# Patient Record
Sex: Male | Born: 1985
Health system: Southern US, Community
[De-identification: ages and names within clinical notes are randomized; demographics above are authoritative.]

## PROBLEM LIST (undated history)

## (undated) DIAGNOSIS — T7840XA Allergy, unspecified, initial encounter: Secondary | ICD-10-CM

## (undated) DIAGNOSIS — Z9109 Other allergy status, other than to drugs and biological substances: Secondary | ICD-10-CM

## (undated) HISTORY — DX: Allergy, unspecified, initial encounter: T78.40XA

## (undated) HISTORY — PX: NO PAST SURGERIES: SHX2092

---

## 2012-11-09 ENCOUNTER — Emergency Department (HOSPITAL_BASED_OUTPATIENT_CLINIC_OR_DEPARTMENT_OTHER): Payer: BC Managed Care – PPO

## 2012-11-09 ENCOUNTER — Encounter (HOSPITAL_BASED_OUTPATIENT_CLINIC_OR_DEPARTMENT_OTHER): Payer: Self-pay

## 2012-11-09 ENCOUNTER — Emergency Department (HOSPITAL_BASED_OUTPATIENT_CLINIC_OR_DEPARTMENT_OTHER)
Admission: EM | Admit: 2012-11-09 | Discharge: 2012-11-09 | Disposition: A | Discharge status: Home / Self Care | Payer: BC Managed Care – PPO | Attending: Emergency Medicine | Admitting: Emergency Medicine

## 2012-11-09 DIAGNOSIS — J309 Allergic rhinitis, unspecified: Secondary | ICD-10-CM | POA: Insufficient documentation

## 2012-11-09 DIAGNOSIS — M542 Cervicalgia: Secondary | ICD-10-CM

## 2012-11-09 DIAGNOSIS — R109 Unspecified abdominal pain: Secondary | ICD-10-CM | POA: Insufficient documentation

## 2012-11-09 DIAGNOSIS — J029 Acute pharyngitis, unspecified: Secondary | ICD-10-CM | POA: Insufficient documentation

## 2012-11-09 HISTORY — DX: Other allergy status, other than to drugs and biological substances: Z91.09

## 2012-11-09 LAB — CBC WITH DIFFERENTIAL/PLATELET
Eosinophils Relative: 3 % (ref 0–5)
HCT: 40.2 % (ref 39.0–52.0)
Lymphocytes Relative: 35 % (ref 12–46)
Lymphs Abs: 3.4 10*3/uL (ref 0.7–4.0)
MCV: 89.7 fL (ref 78.0–100.0)
Monocytes Absolute: 0.9 10*3/uL (ref 0.1–1.0)
RBC: 4.48 MIL/uL (ref 4.22–5.81)
WBC: 9.7 10*3/uL (ref 4.0–10.5)

## 2012-11-09 MED ORDER — IBUPROFEN 800 MG PO TABS: 800.0000 mg | ORAL_TABLET | Freq: Three times a day (TID) | ORAL | Status: DC

## 2012-11-09 NOTE — ED Notes (Signed)
Swelling to glands in neck x 2 weeks-was seen by PCP-completed abx-c/o feeling like throat is closing up started approx 30 min PTA

## 2012-11-09 NOTE — ED Provider Notes (Signed)
History     CSN: [id number]  Arrival date & time 11/09/12  4782   First MD Initiated Contact with Patient 11/09/12 1953      Chief Complaint  Patient presents with  . Lymphadenopathy    (Consider location/radiation/quality/duration/timing/severity/associated sxs/prior treatment) Patient is a 27 y.o. male presenting with pharyngitis. The history is provided by the patient. No language interpreter was used.  Sore Throat This is a new problem. The current episode started today. The problem occurs constantly. The problem has been gradually worsening. Associated symptoms include abdominal pain and a sore throat. Pertinent negatives include no fever. Nothing aggravates the symptoms. He has tried nothing for the symptoms. The treatment provided no relief.   Pt complains of neck feeling swollen.  Pt reports he feels like his glands in his throat are swollen.  Pt was treated 2 weeks ago by primary MD with augmentin.   Pt does not feel like he got any better.  Pt's father died of lymphoma.   Mother reports pt worrys about lymphoma Past Medical History  Diagnosis Date  . Environmental allergies     History reviewed. No pertinent past surgical history.  No family history on file.  History  Substance Use Topics  . Smoking status: Never Smoker   . Smokeless tobacco: Not on file  . Alcohol Use: No      Review of Systems  Constitutional: Negative for fever.  HENT: Positive for sore throat.   Gastrointestinal: Positive for abdominal pain.  All other systems reviewed and are negative.    Allergies  Sulfa antibiotics  Home Medications  No current outpatient prescriptions on file.  BP 151/88  Pulse 94  Temp 98.8 F (37.1 C) (Oral)  Resp 20  Ht 6\' 2"  (1.88 m)  Wt 250 lb (113.399 kg)  BMI 32.10 kg/m2  SpO2 100%  Physical Exam  Nursing note and vitals reviewed. Constitutional: He is oriented to person, place, and time. He appears well-developed and well-nourished.  HENT:    Head: Normocephalic and atraumatic.  Right Ear: External ear normal.  Left Ear: External ear normal.  Nose: Nose normal.  Mouth/Throat: Oropharynx is clear and moist.  Eyes: Conjunctivae normal and EOM are normal. Pupils are equal, round, and reactive to light.  Neck: Normal range of motion. Neck supple.  Cardiovascular: Normal rate.   Pulmonary/Chest: Effort normal and breath sounds normal.  Abdominal: Soft.  Musculoskeletal: Normal range of motion.  Lymphadenopathy:    He has no cervical adenopathy.  Neurological: He is alert and oriented to person, place, and time. He has normal reflexes.    ED Course  Procedures (including critical care time)  Labs Reviewed  CBC WITH DIFFERENTIAL - Abnormal; Notable for the following:    MCHC 36.6 (*)     All other components within normal limits   Dg Neck Soft Tissue  11/09/2012  *RADIOLOGY REPORT*  Clinical Data: Sore throat  NECK SOFT TISSUES - 1+ VIEW  Comparison: None.  Findings: Normal pharyngeal airway.  The trachea is normal.  No prevertebral soft tissue swelling or foreign body.  No bony abnormality.  IMPRESSION: Negative   Original Report Authenticated By: Janeece Riggers, M.D.    Results for orders placed during the hospital encounter of 11/09/12  CBC WITH DIFFERENTIAL      Component Value Range   WBC 9.7  4.0 - 10.5 K/uL   RBC 4.48  4.22 - 5.81 MIL/uL   Hemoglobin 14.7  13.0 - 17.0 g/dL   HCT 40.2  39.0 - 52.0 %   MCV 89.7  78.0 - 100.0 fL   MCH 32.8  26.0 - 34.0 pg   MCHC 36.6 (*) 30.0 - 36.0 g/dL   RDW 45.4  09.8 - 11.9 %   Platelets 264  150 - 400 K/uL   Neutrophils Relative 52  43 - 77 %   Neutro Abs 5.1  1.7 - 7.7 K/uL   Lymphocytes Relative 35  12 - 46 %   Lymphs Abs 3.4  0.7 - 4.0 K/uL   Monocytes Relative 9  3 - 12 %   Monocytes Absolute 0.9  0.1 - 1.0 K/uL   Eosinophils Relative 3  0 - 5 %   Eosinophils Absolute 0.3  0.0 - 0.7 K/uL   Basophils Relative 0  0 - 1 %   Basophils Absolute 0.0  0.0 - 0.1 K/uL   Dg  Neck Soft Tissue  11/09/2012  *RADIOLOGY REPORT*  Clinical Data: Sore throat  NECK SOFT TISSUES - 1+ VIEW  Comparison: None.  Findings: Normal pharyngeal airway.  The trachea is normal.  No prevertebral soft tissue swelling or foreign body.  No bony abnormality.  IMPRESSION: Negative   Original Report Authenticated By: Janeece Riggers, M.D.      No diagnosis found.    MDM  Pt advised that his wbc count is normal.   Pt's soft tissue neck is normal.   I advised see primary care MD for recheck.   Ibuprofen for symptoms.          Lonia Skinner [place], [place] 11/09/12 2101

## 2012-11-11 NOTE — ED Provider Notes (Signed)
Medical screening examination/treatment/procedure(s) were performed by non-physician practitioner and as supervising physician I was immediately available for consultation/collaboration.    Nelia Shi, MD 11/11/12 2237

## 2017-07-06 ENCOUNTER — Encounter (HOSPITAL_BASED_OUTPATIENT_CLINIC_OR_DEPARTMENT_OTHER): Payer: Self-pay | Admitting: *Deleted

## 2017-07-06 ENCOUNTER — Emergency Department (HOSPITAL_BASED_OUTPATIENT_CLINIC_OR_DEPARTMENT_OTHER)
Admission: EM | Admit: 2017-07-06 | Discharge: 2017-07-06 | Disposition: A | Discharge status: Home / Self Care | Payer: Self-pay | Attending: Emergency Medicine | Admitting: Emergency Medicine

## 2017-07-06 DIAGNOSIS — K047 Periapical abscess without sinus: Secondary | ICD-10-CM

## 2017-07-06 DIAGNOSIS — K0889 Other specified disorders of teeth and supporting structures: Secondary | ICD-10-CM | POA: Insufficient documentation

## 2017-07-06 MED ORDER — TRAMADOL HCL 50 MG PO TABS: 50.0000 mg | ORAL_TABLET | Freq: Four times a day (QID) | ORAL | 0 refills | Status: DC | PRN

## 2017-07-06 MED ORDER — AMOXICILLIN 500 MG PO CAPS: 500.0000 mg | ORAL_CAPSULE | Freq: Three times a day (TID) | ORAL | 0 refills | Status: DC

## 2017-07-06 MED ORDER — IBUPROFEN 400 MG PO TABS
400.0000 mg | ORAL_TABLET | Freq: Once | ORAL | Status: AC
  Administered 2017-07-06: 400 mg via ORAL
  Filled 2017-07-06: qty 1

## 2017-07-06 MED ORDER — AMOXICILLIN 500 MG PO CAPS
500.0000 mg | ORAL_CAPSULE | Freq: Once | ORAL | Status: AC
  Administered 2017-07-06: 500 mg via ORAL
  Filled 2017-07-06: qty 1

## 2017-07-06 NOTE — ED Provider Notes (Signed)
[facility]   CSN: [id number] Arrival date & time: 07/06/17  1815     History   Chief Complaint Chief Complaint  Patient presents with  . Dental Pain    HPI Haidar Muse is a 31 y.o. male.  Patient c/o right dental pain, drainage for the past 5 months. Pain moderate, constant, persistent. No acute or abrupt change today. Has no local dentist. No facial or neck swelling. No sore throat or trouble swallowing. No sob. Denies fever or chills. Parent noted some pus draining from area.    The history is provided by the patient.  Dental Pain      Past Medical History:  Diagnosis Date  . Environmental allergies     There are no active problems to display for this patient.   History reviewed. No pertinent surgical history.     Home Medications    Prior to Admission medications   Medication Sig Start Date End Date Taking? Authorizing Provider  ibuprofen (ADVIL,MOTRIN) 800 MG tablet Take 1 tablet (800 mg total) by mouth 3 (three) times daily. 11/09/12   Elson Areas, PA-C    Family History No family history on file.  Social History Social History  Substance Use Topics  . Smoking status: Never Smoker  . Smokeless tobacco: Never Used  . Alcohol use No     Allergies   Sulfa antibiotics   Review of Systems Review of Systems  Constitutional: Negative for fever.  HENT: Negative for sore throat and trouble swallowing.   Respiratory: Negative for shortness of breath.   Gastrointestinal: Negative for vomiting.  Musculoskeletal: Negative for neck pain.  Neurological: Negative for headaches.     Physical Exam Updated Vital Signs BP 118/78   Pulse 80   Temp 98.3 F (36.8 C) (Oral)   Resp 20   Ht 1.88 m (6\' 2" )   Wt 106.8 kg (235 lb 8 oz)   SpO2 98%   BMI 30.24 kg/m   Physical Exam  Constitutional: He appears well-developed and well-nourished. No distress.  HENT:  Mouth/Throat: Oropharynx is clear and moist.  Several  decayed teeth. On right, upper and lower molar with decay, chr broken tooth, associated gum swelling and tenderness. No trismus. No pharyngeal swelling. No swelling to floor of mouth or neck.   Eyes: Conjunctivae are normal.  Neck: Neck supple. No tracheal deviation present.  Cardiovascular: Normal rate.   Pulmonary/Chest: Effort normal. No accessory muscle usage or stridor. No respiratory distress.  Musculoskeletal: He exhibits no edema.  Lymphadenopathy:    He has no cervical adenopathy.  Neurological: He is alert.  Skin: Skin is warm and dry. No rash noted. He is not diaphoretic.  Psychiatric: He has a normal mood and affect.  Nursing note and vitals reviewed.    ED Treatments / Results  Labs (all labs ordered are listed, but only abnormal results are displayed) Labs Reviewed - No data to display  EKG  EKG Interpretation None       Radiology No results found.  Procedures Procedures (including critical care time)  Medications Ordered in ED Medications - No data to display   Initial Impression / Assessment and Plan / ED Course  I have reviewed the triage vital signs and the nursing notes.  Pertinent labs & imaging results that were available during my care of the patient were reviewed by me and considered in my medical decision making (see chart for details).  Confirmed only allergic to sulfa.  Amoxicillin rx.  Motrin po.   rec close dental follow up.     Final Clinical Impressions(s) / ED Diagnoses   Final diagnoses:  None    New Prescriptions New Prescriptions   No medications on file     Cathren LaineSteinl, Ercia Crisafulli, MD 07/06/17 2000

## 2017-07-06 NOTE — Discharge Instructions (Signed)
It was our pleasure to provide your ER care today - we hope that you feel better.  Take antibiotic as prescribed.  Take motrin or aleve as need for pain.   You may also take ultram as need for pain - no driving when taking.  Follow up with dentist in the coming week - see dental resource guide provided.  Return to ER if worse, high fevers, trouble breathing or swallowing, other concern.

## 2017-07-06 NOTE — ED Notes (Signed)
ED Provider at bedside. 

## 2017-07-06 NOTE — ED Triage Notes (Signed)
Abscess tooth. Dental pain x 5 months.

## 2017-07-30 ENCOUNTER — Other Ambulatory Visit (HOSPITAL_COMMUNITY)
Admission: RE | Admit: 2017-07-30 | Discharge: 2017-07-30 | Disposition: A | Discharge status: Home / Self Care | Payer: Self-pay | Source: Ambulatory Visit | Attending: Family Medicine | Admitting: Family Medicine

## 2017-07-30 ENCOUNTER — Ambulatory Visit (INDEPENDENT_AMBULATORY_CARE_PROVIDER_SITE_OTHER): Payer: Self-pay | Admitting: Family Medicine

## 2017-07-30 ENCOUNTER — Encounter: Payer: Self-pay | Admitting: Family Medicine

## 2017-07-30 VITALS — BP 120/72 | HR 85 | Temp 97.7°F | Ht 74.0 in | Wt 248.1 lb

## 2017-07-30 DIAGNOSIS — Z113 Encounter for screening for infections with a predominantly sexual mode of transmission: Secondary | ICD-10-CM

## 2017-07-30 DIAGNOSIS — Z206 Contact with and (suspected) exposure to human immunodeficiency virus [HIV]: Secondary | ICD-10-CM

## 2017-07-30 DIAGNOSIS — Z0000 Encounter for general adult medical examination without abnormal findings: Secondary | ICD-10-CM

## 2017-07-30 NOTE — Patient Instructions (Addendum)
Give us 3-4 business days to get the results of your labs back. If labs are normal, you will likely receive a letter in the mail unless you have MyChart. This can take longer than 2-3 business days.   Ice/cold pack over area for 10-15 min every 2-3 hours while awake.  Ibuprofen 400-600 mg (2-3 over the counter strength tabs) every 6 hours as needed for pain.  Find out when your last tetanus shot was.  Aim to do some physical exertion for 150 minutes per week. This is typically divided into 5 days per week, 30 minutes per day. The activity should be enough to get your heart rate up. Anything is better than nothing if you have time constraints.  Let us know if you need anything.

## 2017-07-30 NOTE — Progress Notes (Signed)
Pre visit review using our clinic review tool, if applicable. No additional management support is needed unless otherwise documented below in the visit note. 

## 2017-07-30 NOTE — Progress Notes (Signed)
Chief Complaint  Patient presents with  . Establish Care    Well Male Phillip Coleman is here for a complete physical.   His last physical was >1 year ago.  Current diet: in general, a "unhealthy" diet   Current exercise: Treadmill Weight trend: has gained Does pt snore? No. Daytime fatigue? No. Seat belt? Yes.      Health maintenance Tetanus- unknown HIV- requesting today; had insertive anal intercourse with HIV positive partner  Past Medical History:  Diagnosis Date  . Allergy   . Environmental allergies     Past Surgical History:  Procedure Laterality Date  . NO PAST SURGERIES     Medications  Takes no medications routinely.   Allergies Allergies  Allergen Reactions  . Sulfa Antibiotics Hives   Family History Family History  Problem Relation Age of Onset  . Hypertension Mother   . Cancer Father        non hodgins lymphoma  . Diabetes Maternal Aunt   . Cancer Maternal Uncle        pancreatic cancer  . Cancer Maternal Grandmother        pancreatic cancer    Review of Systems: Constitutional:  no unexpected change in weight, no fevers or chills Eye:  no recent significant change in vision Ear/Nose/Mouth/Throat:  Ears:  no tinnitus or hearing loss Nose/Mouth/Throat:  no complaints of nasal congestion or bleeding, no sore throat and oral sores Cardiovascular:  no chest pain, no palpitations Respiratory:  no cough and no shortness of breath Gastrointestinal:  no abdominal pain, no change in bowel habits, no nausea, vomiting, diarrhea, or constipation and no black or bloody stool GU:  Male: negative for dysuria, frequency, and incontinence and negative for prostate symptoms Musculoskeletal/Extremities: +L ankle pain; otherwise no pain, redness, or swelling of the joints Integumentary (Skin/Breast):  no abnormal skin lesions reported Neurologic:  +headaches, no numbness, tingling Endocrine: No weight changes, masses in the neck, heat/cold intolerance, bowel or  skin changes, or cardiovascular system symptoms Hematologic/Lymphatic:  no abnormal bleeding, no HIV risk factors, no night sweats, no swollen nodes, no weight loss  Exam BP 120/72 (BP Location: Right Arm, Patient Position: Sitting, Cuff Size: Large)   Pulse 85   Temp 97.7 F (36.5 C) (Oral)   Ht 6\' 2"  (1.88 m)   Wt 248 lb 2 oz (112.5 kg)   SpO2 96%   BMI 31.86 kg/m  General:  well developed, well nourished, in no apparent distress Skin:  no significant moles, warts, or growths Head:  no masses, lesions, or tenderness Eyes:  pupils equal and round, sclera anicteric without injection Ears:  canals without lesions, TMs shiny without retraction, no obvious effusion, no erythema Nose:  nares patent, septum midline, mucosa normal Throat/Pharynx:  lips and gingiva without lesion; tongue and uvula midline; non-inflamed pharynx; no exudates or postnasal drainage Neck: neck supple without adenopathy, thyromegaly, or masses Lungs:  clear to auscultation, breath sounds equal bilaterally, no respiratory distress Cardio:  regular rate and rhythm without murmurs, heart sounds without clicks or rubs Abdomen:  abdomen soft, nontender; bowel sounds normal; no masses or organomegaly Genital (male): circumcised penis, no lesions or discharge; testes present bilaterally without masses or tenderness Rectal: Deferred Musculoskeletal:  symmetrical muscle groups noted without atrophy or deformity Extremities:  no clubbing, cyanosis, or edema, no deformities, no skin discoloration Neuro:  gait normal; deep tendon reflexes normal and symmetric Psych: well oriented with normal range of affect and appropriate judgment/insight  Assessment and Plan  Well adult exam - Plan: Comprehensive metabolic panel, Lipid panel  Exposure to HIV - Plan: HIV antibody  Routine screening for STI (sexually transmitted infection) - Plan: Urine cytology ancillary only, RPR   Well 31 y.o. male. Counseled on diet and  exercise. Other orders as above. He is specifically requesting syphilis testing. We will have to recheck HIV in 6 months. Follow up at earliest convenience to discuss ankle pain/HA's.  The patient voiced understanding and agreement to the plan.  Jilda Roche [place], DO 07/30/17 3:14 PM

## 2017-07-31 LAB — LIPID PANEL
Cholesterol: 192 mg/dL (ref 0–200)
HDL: 47.2 mg/dL (ref >39.00)
LDL Cholesterol: 124 mg/dL — ABNORMAL HIGH (ref 0–99)
NONHDL: 145.2
TRIGLYCERIDES: 108 mg/dL (ref 0.0–149.0)
Total CHOL/HDL Ratio: 4
VLDL: 21.6 mg/dL (ref 0.0–40.0)

## 2017-07-31 LAB — COMPREHENSIVE METABOLIC PANEL
ALT: 42 U/L (ref 0–53)
AST: 20 U/L (ref 0–37)
Albumin: 4.5 g/dL (ref 3.5–5.2)
Alkaline Phosphatase: 48 U/L (ref 39–117)
BILIRUBIN TOTAL: 0.3 mg/dL (ref 0.2–1.2)
BUN: 17 mg/dL (ref 6–23)
CALCIUM: 9.7 mg/dL (ref 8.4–10.5)
CO2: 31 mEq/L (ref 19–32)
CREATININE: 0.97 mg/dL (ref 0.40–1.50)
Chloride: 104 mEq/L (ref 96–112)
GFR: 117.97 mL/min (ref >60.00)
Glucose, Bld: 85 mg/dL (ref 70–99)
Potassium: 3.9 mEq/L (ref 3.5–5.1)
Sodium: 139 mEq/L (ref 135–145)
TOTAL PROTEIN: 7.4 g/dL (ref 6.0–8.3)

## 2017-07-31 LAB — RPR

## 2017-07-31 LAB — HIV ANTIBODY (ROUTINE TESTING W REFLEX): HIV 1&2 Ab, 4th Generation: NONREACTIVE

## 2017-08-03 LAB — URINE CYTOLOGY ANCILLARY ONLY
Chlamydia: NEGATIVE
Neisseria Gonorrhea: NEGATIVE
Trichomonas: NEGATIVE

## 2017-08-10 ENCOUNTER — Other Ambulatory Visit: Payer: Self-pay | Admitting: Family Medicine

## 2017-08-10 DIAGNOSIS — Z206 Contact with and (suspected) exposure to human immunodeficiency virus [HIV]: Secondary | ICD-10-CM

## 2017-11-11 ENCOUNTER — Ambulatory Visit: Payer: Self-pay | Admitting: Family Medicine

## 2017-12-23 ENCOUNTER — Other Ambulatory Visit: Payer: Self-pay

## 2017-12-23 ENCOUNTER — Emergency Department (HOSPITAL_BASED_OUTPATIENT_CLINIC_OR_DEPARTMENT_OTHER)
Admission: EM | Admit: 2017-12-23 | Discharge: 2017-12-23 | Disposition: A | Discharge status: Home / Self Care | Payer: Self-pay | Attending: Emergency Medicine | Admitting: Emergency Medicine

## 2017-12-23 ENCOUNTER — Encounter (HOSPITAL_BASED_OUTPATIENT_CLINIC_OR_DEPARTMENT_OTHER): Payer: Self-pay

## 2017-12-23 DIAGNOSIS — N4821 Abscess of corpus cavernosum and penis: Secondary | ICD-10-CM | POA: Insufficient documentation

## 2017-12-23 MED ORDER — DOXYCYCLINE HYCLATE 100 MG PO TABS
100.0000 mg | ORAL_TABLET | Freq: Once | ORAL | Status: AC
  Administered 2017-12-23: 100 mg via ORAL
  Filled 2017-12-23: qty 1

## 2017-12-23 MED ORDER — IBUPROFEN 600 MG PO TABS: 600.0000 mg | ORAL_TABLET | Freq: Four times a day (QID) | ORAL | 0 refills | Status: DC | PRN

## 2017-12-23 MED ORDER — DOXYCYCLINE HYCLATE 100 MG PO CAPS: 100.0000 mg | ORAL_CAPSULE | Freq: Two times a day (BID) | ORAL | 0 refills | Status: DC

## 2017-12-23 MED FILL — IBUPROFEN 600 MG TABLET: 600 | 8 days supply | Qty: 30 | Fill #0

## 2017-12-23 MED FILL — DOXYCYCLINE HYC 100 MG CAP: 100 | 7 days supply | Qty: 14 | Fill #0

## 2017-12-23 NOTE — Discharge Instructions (Signed)
Please apply warm moist compress to affected area several times daily and try to squeeze the infection out as much as you can.  Take antibiotic as prescribed.  Return in 2 days if you notice no improvement.

## 2017-12-23 NOTE — ED Provider Notes (Signed)
[facility] EMERGENCY DEPARTMENT Provider Note   CSN: [id number] Arrival date & time: 12/23/17  1231     History   Chief Complaint Chief Complaint  Patient presents with  . Groin Swelling    HPI Phillip Coleman is a 32 y.o. male.  HPI   32 year old male with history of environmental allergies and sulfa allergies presenting for evaluation of penile discomfort.  Patient states 3-4 days ago he was using a new body wash that may have sulfate in it.  States that he is allergic to sulfa and for the past 3 days he noticed pain and swelling around his penis.  He attributed the discomfort to an allergic reaction.  He denies any specific treatment tried.  States that he used the body wash throughout entire body but that is the only affected area.  No complaints of fever, headache, throat swelling, chest pain, trouble breathing, abdominal pain.  No history of diabetes.  No change in new sexual partner.  No dysuria.  Past Medical History:  Diagnosis Date  . Allergy   . Environmental allergies     There are no active problems to display for this patient.   Past Surgical History:  Procedure Laterality Date  . NO PAST SURGERIES         Home Medications    Prior to Admission medications   Not on File    Family History Family History  Problem Relation Age of Onset  . Hypertension Mother   . Cancer Father        non hodgins lymphoma  . Diabetes Maternal Aunt   . Cancer Maternal Uncle        pancreatic cancer  . Cancer Maternal Grandmother        pancreatic cancer    Social History Social History   Tobacco Use  . Smoking status: Never Smoker  . Smokeless tobacco: Never Used  Substance Use Topics  . Alcohol use: Yes    Comment: weekly  . Drug use: No     Allergies   Sulfa antibiotics   Review of Systems Review of Systems  All other systems reviewed and are negative.    Physical Exam Updated Vital Signs BP 119/81 (BP Location: Left Arm)   Pulse  86   Temp 98.1 F (36.7 C) (Oral)   Resp 18   Ht 6\' 2"  (1.88 m)   Wt 126 kg (277 lb 12.5 oz)   SpO2 100%   BMI 35.66 kg/m   Physical Exam  Constitutional: He appears well-developed and well-nourished. No distress.  HENT:  Head: Atraumatic.  Eyes: Conjunctivae are normal.  Neck: Neck supple.  Cardiovascular: Normal rate and regular rhythm.  Pulmonary/Chest: Effort normal and breath sounds normal.  Abdominal: Soft. He exhibits no distension. There is no tenderness.  Genitourinary:  Genitourinary Comments: Chaperone present during exam.   circumcised penis with swelling around the corona of the penile gland that is tender to palpation and actively oozing out pustular discharge.  No penile discharge.  Testicle are nontender with normal lie, normal scrotum, perineum is soft.  No inguinal lymphopathy or inguinal hernia  Neurological: He is alert.  Skin: No rash noted.  Psychiatric: He has a normal mood and affect.  Nursing note and vitals reviewed.    ED Treatments / Results  Labs (all labs ordered are listed, but only abnormal results are displayed) Labs Reviewed - No data to display  EKG  EKG Interpretation None       Radiology  No results found.  Procedures Procedures (including critical care time)  Medications Ordered in ED Medications - No data to display   Initial Impression / Assessment and Plan / ED Course  I have reviewed the triage vital signs and the nursing notes.  Pertinent labs & imaging results that were available during my care of the patient were reviewed by me and considered in my medical decision making (see chart for details).     BP 119/81 (BP Location: Left Arm)   Pulse 86   Temp 98.1 F (36.7 C) (Oral)   Resp 18   Ht 6\' 2"  (1.88 m)   Wt 126 kg (277 lb 12.5 oz)   SpO2 100%   BMI 35.66 kg/m    Final Clinical Impressions(s) / ED Diagnoses   Final diagnoses:  Abscess, penis    ED Discharge Orders        Ordered    ibuprofen  (ADVIL,MOTRIN) 600 MG tablet  Every 6 hours PRN     12/23/17 1430    doxycycline (VIBRAMYCIN) 100 MG capsule  2 times daily     12/23/17 1430     2:27 PM Patient complaining of penile swelling.  He attributed to a new body wash product that he use.  On exam, swelling noted to the corona of the penis with induration oozing out pustular discharge consistent with an abscess.  No other abnormal rash.  Suspect cutaneous abscess of the penis causing symptoms. No tenderness along the shaft of the penis.  A wound culture performed.  I was able to express a small amount of pustular discharge.  Will screen for STI.  Patient discharged home with doxycycline, recommend warm compress, and return in 40 hours for recheck.  No systemic finding.  No evidence of Fournier gangrene   Fayrene Helperran, Easter Kennebrew, PA-C 12/23/17 1514    Raeford RazorKohut, Stephen, MD 12/23/17 [phone]

## 2017-12-23 NOTE — ED Triage Notes (Signed)
Pt c/o penile swelling and irritation x 2 days-started the day after using a new shower gel that contained sulfa that he is allergic to-NAD-steady gait

## 2017-12-24 LAB — GC/CHLAMYDIA PROBE AMP (~~LOC~~) NOT AT ARMC
CHLAMYDIA, DNA PROBE: NEGATIVE
NEISSERIA GONORRHEA: NEGATIVE

## 2017-12-24 LAB — RPR: RPR Ser Ql: NONREACTIVE

## 2017-12-24 LAB — HIV ANTIBODY (ROUTINE TESTING W REFLEX): HIV Screen 4th Generation wRfx: NONREACTIVE

## 2017-12-26 LAB — AEROBIC CULTURE W GRAM STAIN (SUPERFICIAL SPECIMEN)

## 2017-12-26 LAB — AEROBIC CULTURE  (SUPERFICIAL SPECIMEN)
GRAM STAIN: NONE SEEN
SPECIAL REQUESTS: NORMAL

## 2017-12-27 ENCOUNTER — Telehealth: Payer: Self-pay

## 2017-12-27 NOTE — Telephone Encounter (Signed)
Post ED Visit - Positive Culture Follow-up  Culture report reviewed by antimicrobial stewardship pharmacist:  []  Enzo BiNathan Batchelder, Pharm.D. []  Celedonio MiyamotoJeremy Frens, Pharm.D., BCPS AQ-ID []  Garvin FilaMike Maccia, Pharm.D., BCPS []  Georgina PillionElizabeth Martin, Pharm.D., BCPS []  GalienMinh Pham, VermontPharm.D., BCPS, AAHIVP []  Estella HuskMichelle Turner, Pharm.D., BCPS, AAHIVP []  Lysle Pearlachel Rumbarger, PharmD, BCPS []  Blake DivineShannon Parkey, PharmD []  Pollyann SamplesAndy Johnston, PharmD, BCPS Ruben Imony Rudisill Pharm D Positive aerobic culture Treated with Doxycycline, organism sensitive to the same and no further patient follow-up is required at this time.  Jerry CarasCullom, Rayann Jolley Burnett 12/27/2017, 9:55 AM

## 2018-05-04 ENCOUNTER — Emergency Department (HOSPITAL_BASED_OUTPATIENT_CLINIC_OR_DEPARTMENT_OTHER)
Admission: EM | Admit: 2018-05-04 | Discharge: 2018-05-04 | Disposition: A | Discharge status: Home / Self Care | Payer: Self-pay | Attending: Emergency Medicine | Admitting: Emergency Medicine

## 2018-05-04 ENCOUNTER — Other Ambulatory Visit: Payer: Self-pay

## 2018-05-04 ENCOUNTER — Encounter (HOSPITAL_BASED_OUTPATIENT_CLINIC_OR_DEPARTMENT_OTHER): Payer: Self-pay

## 2018-05-04 ENCOUNTER — Emergency Department (HOSPITAL_BASED_OUTPATIENT_CLINIC_OR_DEPARTMENT_OTHER): Payer: Self-pay

## 2018-05-04 DIAGNOSIS — J029 Acute pharyngitis, unspecified: Secondary | ICD-10-CM | POA: Insufficient documentation

## 2018-05-04 LAB — RAPID STREP SCREEN (MED CTR MEBANE ONLY): Streptococcus, Group A Screen (Direct): NEGATIVE

## 2018-05-04 MED ORDER — CLINDAMYCIN HCL 150 MG PO CAPS: 450.0000 mg | ORAL_CAPSULE | Freq: Three times a day (TID) | ORAL | 0 refills | Status: AC

## 2018-05-04 MED ORDER — ACETAMINOPHEN 325 MG PO TABS
650.0000 mg | ORAL_TABLET | Freq: Once | ORAL | Status: AC
  Administered 2018-05-04: 650 mg via ORAL
  Filled 2018-05-04: qty 2

## 2018-05-04 MED ORDER — DEXAMETHASONE SODIUM PHOSPHATE 10 MG/ML IJ SOLN
10.0000 mg | Freq: Once | INTRAMUSCULAR | Status: DC
  Filled 2018-05-04: qty 1

## 2018-05-04 MED ORDER — DEXAMETHASONE SODIUM PHOSPHATE 10 MG/ML IJ SOLN
10.0000 mg | Freq: Once | INTRAMUSCULAR | Status: AC
  Administered 2018-05-04: 10 mg via INTRAMUSCULAR

## 2018-05-04 NOTE — ED Provider Notes (Signed)
[facility] EMERGENCY DEPARTMENT Provider Note   CSN: [id number] Arrival date & time: 05/04/18  1857     History   Chief Complaint Chief Complaint  Patient presents with  . Cough    HPI Phillip Coleman is a 32 y.o. male presents for evaluation of 1 week of sore throat and cough.  Patient reports the cough is productive of yellow phlegm.  He reports he had one episode where he had blood-tinged mucus but no hemoptysis.  Patient reports he has taken Zyrtec and TheraFlu for his sore throat.  He has not taken any Tylenol or ibuprofen.  Patient reports he has been able to tolerate his secretions without any difficulty.  Patient denies any nausea/vomiting.  Patient has not had any difficulty breathing.  Patient reports he has had some nasal congestion.  Patient denies any fevers, chest pain, difficulty breathing, vomiting.  The history is provided by the patient.    Past Medical History:  Diagnosis Date  . Allergy   . Environmental allergies     There are no active problems to display for this patient.   Past Surgical History:  Procedure Laterality Date  . NO PAST SURGERIES          Home Medications    Prior to Admission medications   Medication Sig Start Date End Date Taking? Authorizing Provider  clindamycin (CLEOCIN) 150 MG capsule Take 3 capsules (450 mg total) by mouth 3 (three) times daily for 7 days. 05/04/18 05/11/18  Maxwell Caul, PA-C  doxycycline (VIBRAMYCIN) 100 MG capsule Take 1 capsule (100 mg total) by mouth 2 (two) times daily. One po bid x 7 days 12/23/17   Fayrene Helper, PA-C  ibuprofen (ADVIL,MOTRIN) 600 MG tablet Take 1 tablet (600 mg total) by mouth every 6 (six) hours as needed for moderate pain. 12/23/17   Fayrene Helper, PA-C    Family History Family History  Problem Relation Age of Onset  . Hypertension Mother   . Cancer Father        non hodgins lymphoma  . Diabetes Maternal Aunt   . Cancer Maternal Uncle        pancreatic cancer  .  Cancer Maternal Grandmother        pancreatic cancer    Social History Social History   Tobacco Use  . Smoking status: Never Smoker  . Smokeless tobacco: Never Used  Substance Use Topics  . Alcohol use: Not Currently  . Drug use: No     Allergies   Sulfa antibiotics   Review of Systems Review of Systems  Constitutional: Negative for fever.  HENT: Positive for congestion, ear pain and sore throat.   Respiratory: Positive for cough. Negative for shortness of breath.   Cardiovascular: Negative for chest pain.  Gastrointestinal: Negative for nausea and vomiting.  All other systems reviewed and are negative.    Physical Exam Updated Vital Signs BP 132/86 (BP Location: Left Arm)   Pulse 99   Temp (!) 100.4 F (38 C) (Oral)   Resp 18   Ht  (1.905 m)   Wt 133.4 kg (294 lb)   SpO2 97%   BMI 36.75 kg/m   Physical Exam  Constitutional: He appears well-developed and well-nourished.  HENT:  Head: Normocephalic and atraumatic.  Right Ear: Tympanic membrane normal. Tympanic membrane is not erythematous and not retracted.  Left Ear: Tympanic membrane normal. Tympanic membrane is not erythematous and not retracted.  Mouth/Throat: Uvula is midline and mucous membranes are normal.  No trismus in the jaw. Posterior oropharyngeal edema and posterior oropharyngeal erythema present.  Airway is patent, phonation is intact. Uvula is midline. No trismus. Limited visibility secondary to patient's inability to cooperate. From the limited visualization, he does have some posterior oropharynx edema and erythema. No facial swelling. No oral angioedema.   Eyes: Conjunctivae and EOM are normal. Right eye exhibits no discharge. Left eye exhibits no discharge. No scleral icterus.  Pulmonary/Chest: Effort normal and breath sounds normal.  Lungs clear to auscultation bilaterally.  Symmetric chest rise.  No wheezing, rales, rhonchi.  Neurological: He is alert.  Skin: Skin is warm and dry.    Psychiatric: He has a normal mood and affect. His speech is normal and behavior is normal.  Nursing note and vitals reviewed.    ED Treatments / Results  Labs (all labs ordered are listed, but only abnormal results are displayed) Labs Reviewed  RAPID STREP SCREEN ([facility] ONLY)  CULTURE, GROUP A STREP [facility])    EKG None  Radiology Dg Chest 2 View  Result Date: 05/04/2018 CLINICAL DATA:  32 y/o male with sore throat, ear congestion, dysphagia, hemoptysis, productive cough, fever, headache x1 week. Non-smoker. EXAM: CHEST - 2 VIEW COMPARISON:  None. FINDINGS: Study is slightly hypoinspiratory with crowding of the perihilar bronchovascular markings. Given the low lung volumes, lungs are clear. Heart size and mediastinal contours are within normal limits. No pleural effusion or pneumothorax seen. Osseous structures about the chest are unremarkable. IMPRESSION: Low lung volumes. No active cardiopulmonary disease. No evidence of pneumonia or pulmonary edema. Electronically Signed   By: Bary Richard M.D.   On: 05/04/2018 20:28    Procedures Procedures (including critical care time)  Medications Ordered in ED Medications  dexamethasone (DECADRON) injection 10 mg (10 mg Intramuscular Given 05/04/18 2019)  acetaminophen (TYLENOL) tablet 650 mg (650 mg Oral Given 05/04/18 2048)     Initial Impression / Assessment and Plan / ED Course  I have reviewed the triage vital signs and the nursing notes.  Pertinent labs & imaging results that were available during my care of the patient were reviewed by me and considered in my medical decision making (see chart for details).     32 y.o. M who presents for evaluation of 1 week of sore throat, cough, nasal congestion. Has been able to tolerate PO without any difficulty. On initial ED arrival, patient is febrile.  Vital signs otherwise stable.  On exam, limited evaluation of posterior oropharynx.  Given patient's inability to cooperate with exam.   On evaluation, uvula does appear midline with no evidence of asymmetric tonsillar swelling.  He does have some erythema and edema.  Lungs clear to auscultation bilaterally.  Consider pharyngitis versus upper respiratory infection versus pneumonia.  We will plan to check rapid strep, chest x-ray.  Rapid strep reviewed and negative.  Unclear if this is from limited sample given patient's inability to cooperate with exam. CXR is negative for any acute pneumonia.  The patient is afebrile with negative strep and given limited evaluation, will plan to treat with clindamycin for possible peritonsillar abscess, though I do not see one on today's exam.  Decadron given in the ED.  Updated patient on plan.  He is agreeable.  Instructed patient to follow-up with his primary care doctor in the next 2 to 4 days for further evaluation. Patient had ample opportunity for questions and discussion. All patient's questions were answered with full understanding. Strict return precautions discussed. Patient expresses understanding and  agreement to plan.   Final Clinical Impressions(s) / ED Diagnoses   Final diagnoses:  Pharyngitis, unspecified etiology    ED Discharge Orders        Ordered    clindamycin (CLEOCIN) 150 MG capsule  3 times daily     05/04/18 2042       Maxwell Caul, PA-C 05/05/18 0015    Loren Racer, MD 05/09/18 [phone]

## 2018-05-04 NOTE — ED Triage Notes (Signed)
C/o flu like sx x 1 week-NAD-steady gait 

## 2018-05-04 NOTE — Discharge Instructions (Signed)
You can take Tylenol or Ibuprofen as directed for pain. You can alternate Tylenol and Ibuprofen every 4 hours. If you take Tylenol at 1pm, then you can take Ibuprofen at 5pm. Then you can take Tylenol again at 9pm.   Take antibiotics as directed. Please take all of your antibiotics until finished.  Follow-up with your primary care doctor in the next 24-48 hours.   Return to the Emergency Department for any worsening pain, fever, vomiting, difficulty breathing, inability to swallow your spit, or any other worsening or concerning symptoms.

## 2018-05-07 LAB — CULTURE, GROUP A STREP (THRC)

## 2018-06-24 ENCOUNTER — Encounter (HOSPITAL_BASED_OUTPATIENT_CLINIC_OR_DEPARTMENT_OTHER): Payer: Self-pay | Admitting: Emergency Medicine

## 2018-06-24 ENCOUNTER — Emergency Department (HOSPITAL_BASED_OUTPATIENT_CLINIC_OR_DEPARTMENT_OTHER)
Admission: EM | Admit: 2018-06-24 | Discharge: 2018-06-24 | Disposition: A | Discharge status: Home / Self Care | Payer: Self-pay | Attending: Emergency Medicine | Admitting: Emergency Medicine

## 2018-06-24 ENCOUNTER — Other Ambulatory Visit: Payer: Self-pay

## 2018-06-24 DIAGNOSIS — L0231 Cutaneous abscess of buttock: Secondary | ICD-10-CM | POA: Insufficient documentation

## 2018-06-24 DIAGNOSIS — J020 Streptococcal pharyngitis: Secondary | ICD-10-CM | POA: Insufficient documentation

## 2018-06-24 LAB — CBG MONITORING, ED: Glucose-Capillary: 84 mg/dL (ref 70–99)

## 2018-06-24 LAB — RAPID STREP SCREEN (MED CTR MEBANE ONLY): Streptococcus, Group A Screen (Direct): POSITIVE — AB

## 2018-06-24 MED ORDER — CEPHALEXIN 500 MG PO CAPS: 500.0000 mg | ORAL_CAPSULE | Freq: Four times a day (QID) | ORAL | 0 refills | Status: DC

## 2018-06-24 MED ORDER — PENTAFLUOROPROP-TETRAFLUOROETH EX AERO
INHALATION_SPRAY | CUTANEOUS | Status: AC
  Administered 2018-06-24: 30
  Filled 2018-06-24: qty 30

## 2018-06-24 MED ORDER — ACETAMINOPHEN 500 MG PO TABS
1000.0000 mg | ORAL_TABLET | Freq: Once | ORAL | Status: AC
  Administered 2018-06-24: 1000 mg via ORAL
  Filled 2018-06-24: qty 2

## 2018-06-24 MED ORDER — LIDOCAINE-EPINEPHRINE (PF) 2 %-1:200000 IJ SOLN
10.0000 mL | Freq: Once | INTRAMUSCULAR | Status: AC
  Administered 2018-06-24: 10 mL
  Filled 2018-06-24 (×2): qty 10

## 2018-06-24 NOTE — ED Triage Notes (Signed)
Patient states that he has a boil to his buttocks for about 2 weeks - patient states that he started to feel "cold and I am never cold I am always hot" since yesterday and had headache

## 2018-06-24 NOTE — Discharge Instructions (Signed)
For your abscess you need to soak and Epson salt in the bathtub for 20 minutes 2 times a day for the next 2 to 3 days until the drainage stops.  Wear a femine pad in your underwear. For your strep throat in the next 2 days you need to get a new toothbrush and make sure you do not share any drinks with people and no kissing people so that you do not spread this infection.  Make sure you are washing her hands regularly.  The antibiotic you are getting will take care of the strep throat and the abscess.

## 2018-06-24 NOTE — ED Provider Notes (Signed)
[facility] EMERGENCY DEPARTMENT Provider Note   CSN: [id number] Arrival date & time: 06/24/18  0807     History   Chief Complaint Chief Complaint  Patient presents with  . Abscess  . Headache    HPI Phillip Coleman is a 32 y.o. male.  The history is provided by the patient.  Abscess  Location:  Pelvis Pelvic abscess location:  R buttock Abscess quality: draining, fluctuance, induration, painful and redness   Red streaking: no   Duration:  2 weeks Progression:  Worsening Pain details:    Quality:  Throbbing   Severity:  Moderate   Duration:  2 weeks   Timing:  Constant   Progression:  Worsening Chronicity:  New Context: not diabetes, not immunosuppression, not injected drug use and not skin injury   Relieved by:  Nothing Exacerbated by: Palpation. Ineffective treatments: some boil cream he got over the counter. Associated symptoms: headaches   Associated symptoms comment:  Pt states since yesterday he has felt chilled and cold with sore throat.  Treated for strep in January.  No cough, SOB or abd pain.  Also having a headache today.  No diarrhea or urinary complaints.   Risk factors comment:  Pt tested regularly for HIV and syphilis.  most recently 6months ago and neg. Headache      Past Medical History:  Diagnosis Date  . Allergy   . Environmental allergies     There are no active problems to display for this patient.   Past Surgical History:  Procedure Laterality Date  . NO PAST SURGERIES          Home Medications    Prior to Admission medications   Medication Sig Start Date End Date Taking? Authorizing Provider  doxycycline (VIBRAMYCIN) 100 MG capsule Take 1 capsule (100 mg total) by mouth 2 (two) times daily. One po bid x 7 days 12/23/17   Fayrene Helperran, [place], PA-C  ibuprofen (ADVIL,MOTRIN) 600 MG tablet Take 1 tablet (600 mg total) by mouth every 6 (six) hours as needed for moderate pain. 12/23/17   Fayrene Helperran, [place], PA-C    Family History Family  History  Problem Relation Age of Onset  . Hypertension Mother   . Cancer Father        non hodgins lymphoma  . Diabetes Maternal Aunt   . Cancer Maternal Uncle        pancreatic cancer  . Cancer Maternal Grandmother        pancreatic cancer    Social History Social History   Tobacco Use  . Smoking status: Never Smoker  . Smokeless tobacco: Never Used  Substance Use Topics  . Alcohol use: Not Currently  . Drug use: No     Allergies   Sulfa antibiotics   Review of Systems Review of Systems  Neurological: Positive for headaches.  All other systems reviewed and are negative.    Physical Exam Updated Vital Signs BP (!) 100/55 (BP Location: Left Arm)   Pulse 100   Temp 99.7 F (37.6 C) (Oral)   Resp 18   Ht 6\' 2"  (1.88 m)   Wt 127 kg (280 lb)   SpO2 96%   BMI 35.95 kg/m   Physical Exam  Constitutional: He is oriented to person, place, and time. He appears well-developed and well-nourished. No distress.  HENT:  Head: Normocephalic and atraumatic.  Right Ear: Tympanic membrane normal.  Left Ear: Tympanic membrane normal.  Mouth/Throat: Mucous membranes are dry. Posterior oropharyngeal erythema present. No oropharyngeal  exudate.  Eyes: Pupils are equal, round, and reactive to light. Conjunctivae and EOM are normal.  Neck: Normal range of motion. Neck supple. Normal range of motion present. No Brudzinski's sign and no Kernig's sign noted.  Cardiovascular: Normal rate, regular rhythm and intact distal pulses.  No murmur heard. Pulmonary/Chest: Effort normal and breath sounds normal. No respiratory distress. He has no wheezes. He has no rales.  Abdominal: Soft. He exhibits no distension. There is no tenderness. There is no rebound and no guarding.  Genitourinary:     Musculoskeletal: Normal range of motion. He exhibits no edema or tenderness.  Neurological: He is alert and oriented to person, place, and time.  Skin: Skin is warm and dry. No rash noted. No  erythema.  Psychiatric: He has a normal mood and affect. His behavior is normal.  Nursing note and vitals reviewed.    ED Treatments / Results  Labs (all labs ordered are listed, but only abnormal results are displayed) Labs Reviewed  RAPID STREP SCREEN ([facility] ONLY) - Abnormal; Notable for the following components:      Result Value   Streptococcus, Group A Screen (Direct) POSITIVE (*)    All other components within normal limits  CBG MONITORING, ED    EKG None  Radiology No results found.  Procedures Procedures (including critical care time)  INCISION AND DRAINAGE Performed by: Gwyneth Sprout Consent: Verbal consent obtained. Risks and benefits: risks, benefits and alternatives were discussed Type: abscess  Body area: right buttocks  Anesthesia: local infiltration  Incision was made with a scalpel.  Local anesthetic: lidocaine 2% with epinephrine  Anesthetic total: 4 ml  Complexity: complex Blunt dissection to break up loculations  Drainage: bloody drainage  Drainage amount: 1mL  Packing material: none  Patient tolerance: Patient tolerated the procedure well with no immediate complications.     Medications Ordered in ED Medications  acetaminophen (TYLENOL) tablet 1,000 mg (has no administration in time range)  lidocaine-EPINEPHrine (XYLOCAINE W/EPI) 2 %-1:200000 (PF) injection 10 mL (has no administration in time range)     Initial Impression / Assessment and Plan / ED Course  I have reviewed the triage vital signs and the nursing notes.  Pertinent labs & imaging results that were available during my care of the patient were reviewed by me and considered in my medical decision making (see chart for details).    Healthy 32 year old male presenting today with abscess that is been worsening for 2 weeks.  It is on the buttocks but does not appear to involve the anus or the scrotum.  I&D as above.  Patient is also complaining of systemic symptoms  symptoms of chills, headache and sore throat.  This could be related to abscess with systemic symptoms versus developing a URI or strep throat.  Strep swab was sent.  Patient given Tylenol.  Blood sugar is within normal limits.  Patient will be placed on keflex for mild cellulitis and for strep throat as his test was positive.    Final Clinical Impressions(s) / ED Diagnoses   Final diagnoses:  Abscess of buttock, right  Strep pharyngitis    ED Discharge Orders        Ordered    cephALEXin (KEFLEX) 500 MG capsule  4 times daily     06/24/18 1610       Gwyneth Sprout, MD 06/24/18 [phone]

## 2018-07-19 ENCOUNTER — Ambulatory Visit: Payer: Self-pay | Admitting: Family Medicine

## 2018-07-19 ENCOUNTER — Other Ambulatory Visit (HOSPITAL_COMMUNITY)
Admission: RE | Admit: 2018-07-19 | Discharge: 2018-07-19 | Disposition: A | Discharge status: Home / Self Care | Payer: Self-pay | Source: Ambulatory Visit | Attending: Family Medicine | Admitting: Family Medicine

## 2018-07-19 ENCOUNTER — Encounter: Payer: Self-pay | Admitting: Family Medicine

## 2018-07-19 VITALS — BP 112/80 | HR 78 | Temp 98.3°F | Ht 74.0 in | Wt 296.1 lb

## 2018-07-19 DIAGNOSIS — Z23 Encounter for immunization: Secondary | ICD-10-CM

## 2018-07-19 DIAGNOSIS — Z113 Encounter for screening for infections with a predominantly sexual mode of transmission: Secondary | ICD-10-CM | POA: Insufficient documentation

## 2018-07-19 DIAGNOSIS — Z0000 Encounter for general adult medical examination without abnormal findings: Secondary | ICD-10-CM

## 2018-07-19 DIAGNOSIS — Z114 Encounter for screening for human immunodeficiency virus [HIV]: Secondary | ICD-10-CM

## 2018-07-19 DIAGNOSIS — R5383 Other fatigue: Secondary | ICD-10-CM

## 2018-07-19 LAB — LIPID PANEL
CHOL/HDL RATIO: 5
Cholesterol: 198 mg/dL (ref 0–200)
HDL: 37.7 mg/dL — ABNORMAL LOW (ref >39.00)
LDL CALC: 134 mg/dL — AB (ref 0–99)
NONHDL: 159.98
Triglycerides: 130 mg/dL (ref 0.0–149.0)
VLDL: 26 mg/dL (ref 0.0–40.0)

## 2018-07-19 LAB — COMPREHENSIVE METABOLIC PANEL
ALK PHOS: 54 U/L (ref 39–117)
ALT: 46 U/L (ref 0–53)
AST: 17 U/L (ref 0–37)
Albumin: 4.3 g/dL (ref 3.5–5.2)
BILIRUBIN TOTAL: 0.5 mg/dL (ref 0.2–1.2)
BUN: 21 mg/dL (ref 6–23)
CO2: 27 meq/L (ref 19–32)
CREATININE: 1.07 mg/dL (ref 0.40–1.50)
Calcium: 10 mg/dL (ref 8.4–10.5)
Chloride: 104 mEq/L (ref 96–112)
GFR: 104.63 mL/min (ref >60.00)
GLUCOSE: 90 mg/dL (ref 70–99)
Potassium: 4.1 mEq/L (ref 3.5–5.1)
Sodium: 139 mEq/L (ref 135–145)
TOTAL PROTEIN: 7.2 g/dL (ref 6.0–8.3)

## 2018-07-19 LAB — CBC
HCT: 43.4 % (ref 39.0–52.0)
HEMOGLOBIN: 14.8 g/dL (ref 13.0–17.0)
MCHC: 34.1 g/dL (ref 30.0–36.0)
MCV: 93.9 fl (ref 78.0–100.0)
Platelets: 281 10*3/uL (ref 150.0–400.0)
RBC: 4.62 Mil/uL (ref 4.22–5.81)
RDW: 13.6 % (ref 11.5–15.5)
WBC: 7.1 10*3/uL (ref 4.0–10.5)

## 2018-07-19 LAB — TSH: TSH: 0.4 u[IU]/mL (ref 0.35–4.50)

## 2018-07-19 NOTE — Progress Notes (Signed)
Chief Complaint  Patient presents with  . Annual Exam    Well Male Willaim BaneJustin Coleman is here for a complete physical.   His last physical was >1 year ago.  Current diet: in general, a "ok" diet.   Current exercise: intermittently goes to gym; cardio Weight trend: stable  Does pt snore? No. Daytime fatigue? Yes. Seat belt? Yes.    Health maintenance Tetanus- No HIV- No  Pt is inquiring about Truvada as he does engage in sexual activity with HIV positive males.   Past Medical History:  Diagnosis Date  . Allergy   . Environmental allergies      Past Surgical History:  Procedure Laterality Date  . NO PAST SURGERIES      Medications  Current Outpatient Medications on File Prior to Visit  Medication Sig Dispense Refill  . ibuprofen (ADVIL,MOTRIN) 600 MG tablet Take 1 tablet (600 mg total) by mouth every 6 (six) hours as needed for moderate pain. 30 tablet 0   Allergies Allergies  Allergen Reactions  . Sulfa Antibiotics Hives   Family History Family History  Problem Relation Age of Onset  . Hypertension Mother   . Cancer Father        non hodgins lymphoma  . Diabetes Maternal Aunt   . Cancer Maternal Uncle        pancreatic cancer  . Cancer Maternal Grandmother        pancreatic cancer   Review of Systems: Constitutional: no fevers or chills Eye:  no recent significant change in vision Ear/Nose/Mouth/Throat:  Ears:  no tinnitus or hearing loss Nose/Mouth/Throat:  no complaints of nasal congestion, no sore throat Cardiovascular:  no chest pain, no palpitations Respiratory:  no cough and no shortness of breath Gastrointestinal:  no abdominal pain, no change in bowel habits GU:  Male: negative for dysuria, frequency, and incontinence and negative for prostate symptoms Musculoskeletal/Extremities:  no pain, redness, or swelling of the joints Integumentary (Skin/Breast):  no abnormal skin lesions reported Neurologic:  no headaches, no numbness, tingling Endocrine:  No unexpected weight changes Hematologic/Lymphatic:  no night sweats  Exam BP 112/80 (BP Location: Left Arm, Patient Position: Sitting, Cuff Size: Large)   Pulse 78   Temp 98.3 F (36.8 C) (Oral)   Ht 6\' 2"  (1.88 m)   Wt 296 lb 2 oz (134.3 kg)   SpO2 96%   BMI 38.02 kg/m  General:  well developed, well nourished, in no apparent distress Skin:  no significant moles, warts, or growths Head:  no masses, lesions, or tenderness Eyes:  pupils equal and round, sclera anicteric without injection Ears:  canals without lesions, cerumen present b/l; TMs shiny without retraction, no obvious effusion, no erythema Nose:  nares patent, septum midline, mucosa normal Throat/Pharynx:  lips and gingiva without lesion; tongue and uvula midline; non-inflamed pharynx; no exudates or postnasal drainage Neck: neck supple without adenopathy, thyromegaly, or masses Lungs:  clear to auscultation, breath sounds equal bilaterally, no respiratory distress Cardio:  regular rate and rhythm, no bruits, no LE edema Abdomen:  abdomen soft, nontender; bowel sounds normal; no masses or organomegaly Genital (male): circumcised penis, no lesions or discharge; testes present bilaterally without masses or tenderness Rectal: Deferred Musculoskeletal:  symmetrical muscle groups noted without atrophy or deformity Extremities:  no clubbing, cyanosis, or edema, no deformities, no skin discoloration Neuro:  gait normal; deep tendon reflexes normal and symmetric Psych: well oriented with normal range of affect and appropriate judgment/insight  Assessment and Plan  Well adult exam -  Plan: CBC, Comprehensive metabolic panel, Lipid panel  Fatigue, unspecified type - Plan: TSH  Screening for HIV (human immunodeficiency virus) - Plan: HIV antibody  Routine screening for STI (sexually transmitted infection) - Plan: Urine cytology ancillary only   Well 32 y.o. male. Counseled on diet and exercise. Ck labs for fatigue. I hope he  does not have HIV, but will check. If he is positive, will refer to ID. He requested information regarding PreP, Truvada pt info given. Other orders as above. Follow up in 1 year pending the above workup. The patient voiced understanding and agreement to the plan.  [name], DO 07/19/18 8:46 AM

## 2018-07-19 NOTE — Progress Notes (Signed)
Pre visit review using our clinic review tool, if applicable. No additional management support is needed unless otherwise documented below in the visit note. 

## 2018-07-19 NOTE — Patient Instructions (Addendum)
Try taking some Metamucil if you start getting looser stools. A probiotic may help also.  Give us 2-3 business days to get the results of your labs back. If labs are normal, you will likely receive a letter in the mail unless you have MyChart. This can take longer than 2-3 business days.   [email or web address]  OK to use Debrox (peroxide) in the ear to loosen up wax. Also recommend using a bulb syringe (for removing boogers from baby's noses) to flush through warm water. Do not use Q-tips as this can impact wax further.  Let us know if you need anything.

## 2018-07-20 LAB — HIV ANTIBODY (ROUTINE TESTING W REFLEX): HIV: NONREACTIVE

## 2018-07-21 LAB — URINE CYTOLOGY ANCILLARY ONLY
Chlamydia: NEGATIVE
NEISSERIA GONORRHEA: NEGATIVE
TRICH (WINDOWPATH): NEGATIVE

## 2018-10-25 IMAGING — CR DG CHEST 2V
2 series · 2 of 2 positions shown · non-contrast
Comparison: None.

CLINICAL DATA: 31 y/o male with sore throat, ear congestion,
dysphagia, hemoptysis, productive cough, fever, headache x1 week.
Non-smoker.

EXAM:
CHEST - 2 VIEW

[w chest pa]
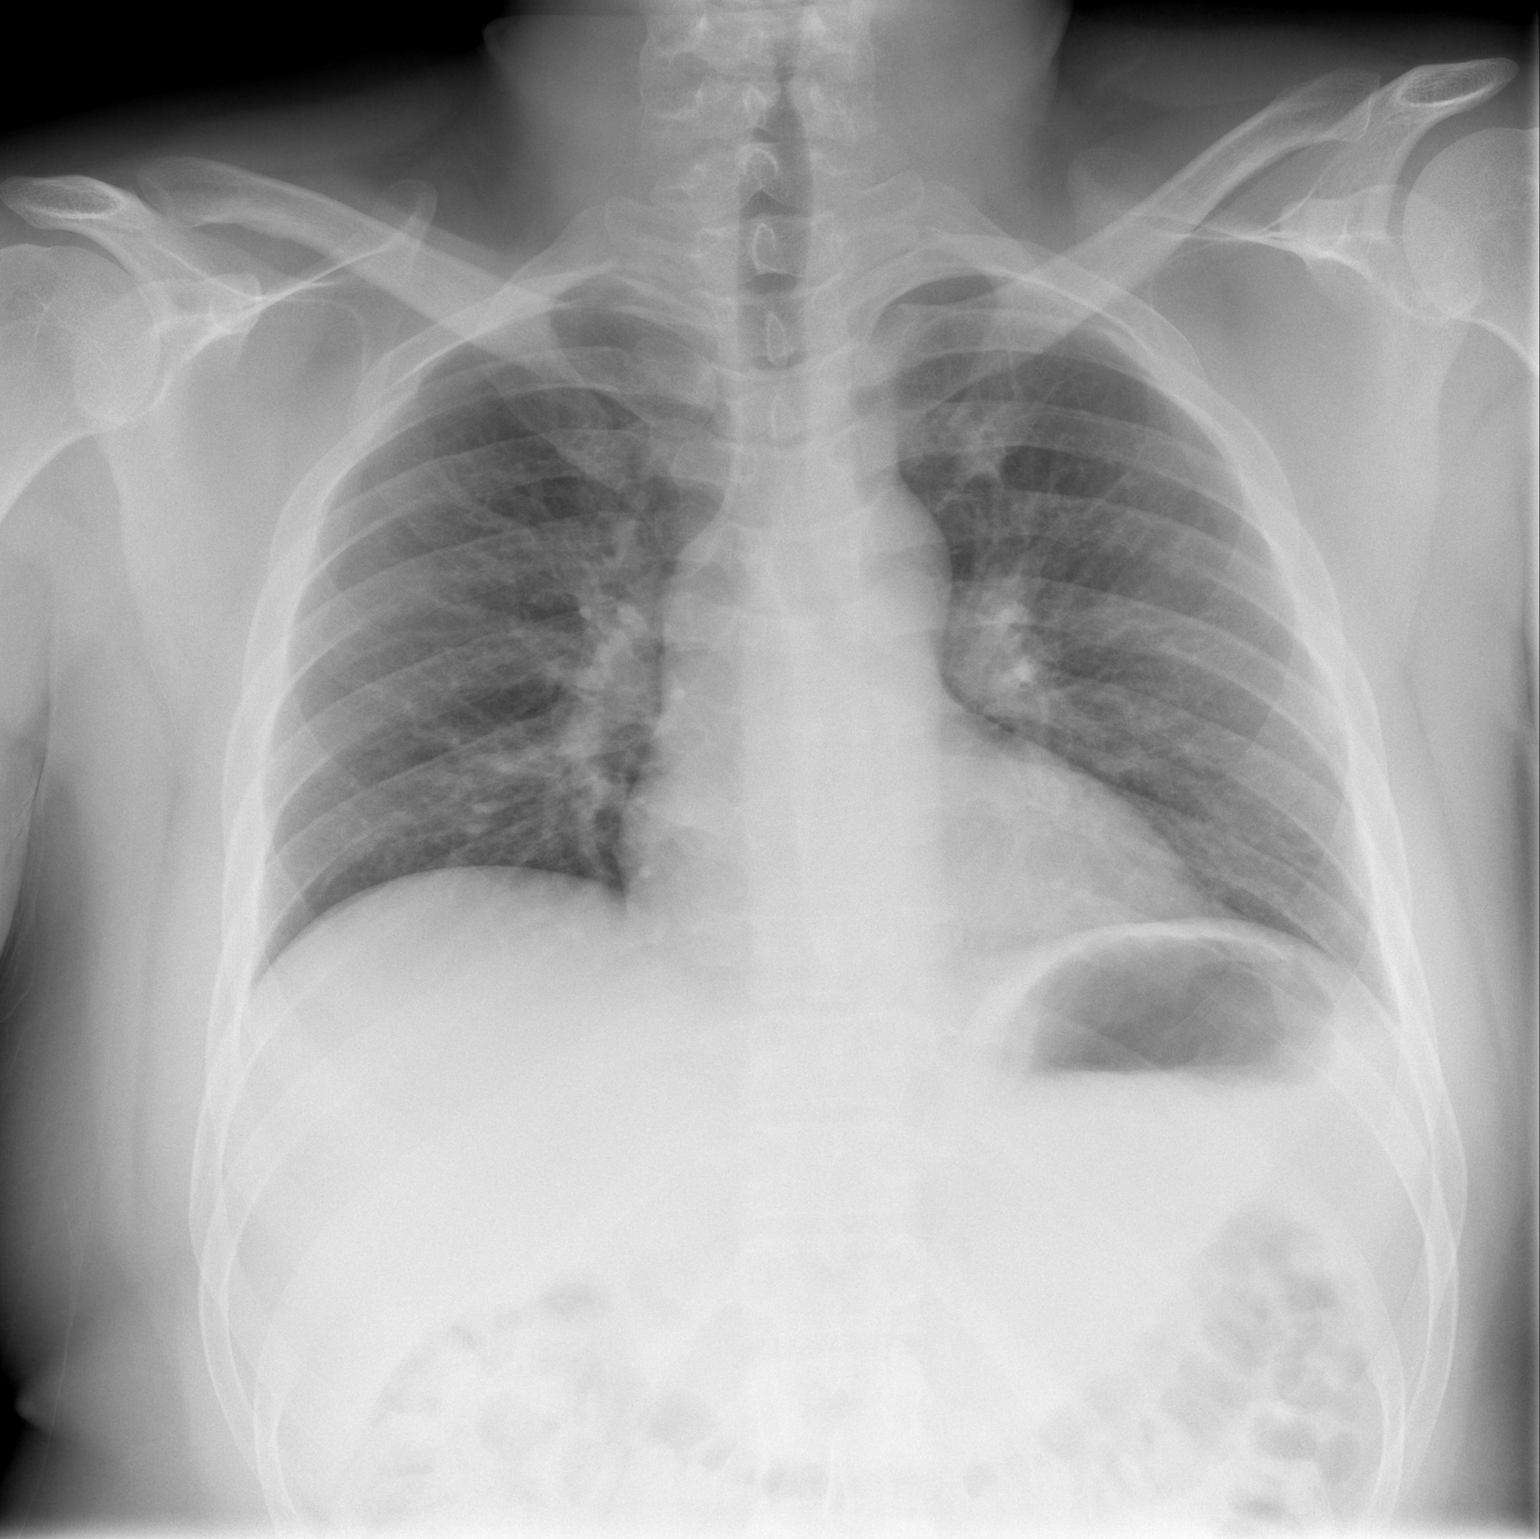

[w chest lat]
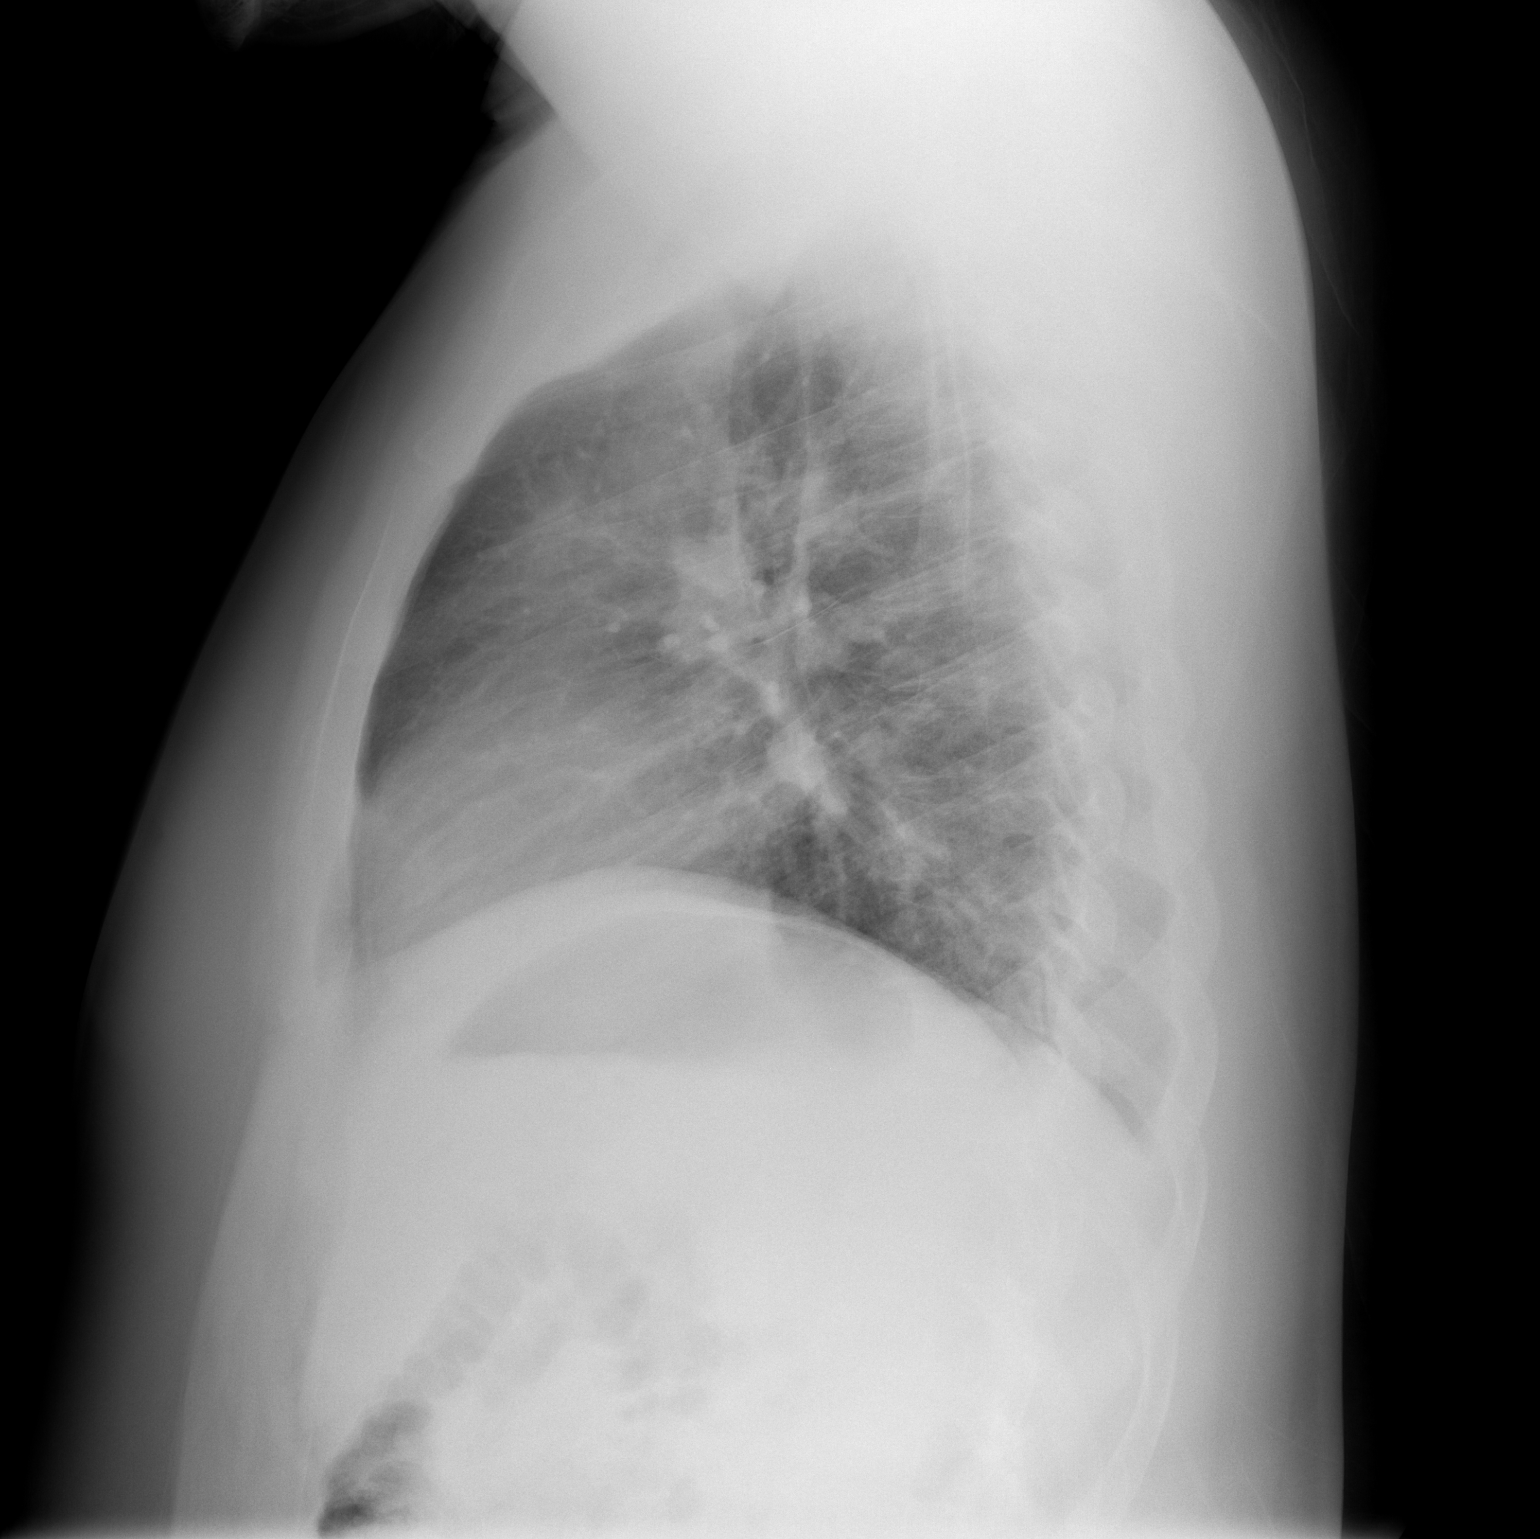

[2 of 2 positions shown; findings below may reference images not displayed]

FINDINGS: Study is slightly hypoinspiratory with crowding of the perihilar
bronchovascular markings. Given the low lung volumes, lungs are
clear. Heart size and mediastinal contours are within normal limits.
No pleural effusion or pneumothorax seen. Osseous structures about
the chest are unremarkable.
IMPRESSION: Low lung volumes. No active cardiopulmonary disease. No evidence of
pneumonia or pulmonary edema.

## 2020-07-06 ENCOUNTER — Encounter: Payer: Self-pay | Admitting: Family Medicine

## 2020-07-06 ENCOUNTER — Ambulatory Visit (INDEPENDENT_AMBULATORY_CARE_PROVIDER_SITE_OTHER): Payer: Self-pay | Admitting: Family Medicine

## 2020-07-06 ENCOUNTER — Other Ambulatory Visit: Payer: Self-pay

## 2020-07-06 VITALS — BP 120/80 | HR 87 | Temp 98.1°F | Ht 74.0 in | Wt 278.4 lb

## 2020-07-06 DIAGNOSIS — E669 Obesity, unspecified: Secondary | ICD-10-CM

## 2020-07-06 DIAGNOSIS — Z005 Encounter for examination of potential donor of organ and tissue: Secondary | ICD-10-CM

## 2020-07-06 NOTE — Patient Instructions (Addendum)
Keep the diet clean and stay active.  Do monthly self testicular checks in the shower. You are feeling for lumps/bumps that don't belong. If you feel anything like this, let me know!   Let us know if you need anything.  Healthy Eating Plan Many factors influence your heart health, including eating and exercise habits. Heart (coronary) risk increases with abnormal blood fat (lipid) levels. Heart-healthy meal planning includes limiting unhealthy fats, increasing healthy fats, and making other small dietary changes. This includes maintaining a healthy body weight to help keep lipid levels within a normal range.  WHAT IS MY PLAN?  Your health care provider recommends that you:  Drink a glass of water before meals to help with satiety.  Eat slowly.  An alternative to the water is to add Metamucil. This will help with satiety as well. It does contain calories, unlike water.  WHAT TYPES OF FAT SHOULD I CHOOSE?  Choose healthy fats more often. Choose monounsaturated and polyunsaturated fats, such as olive oil and canola oil, flaxseeds, walnuts, almonds, and seeds.  Eat more omega-3 fats. Good choices include salmon, mackerel, sardines, tuna, flaxseed oil, and ground flaxseeds. Aim to eat fish at least two times each week.  Avoid foods with partially hydrogenated oils in them. These contain trans fats. Examples of foods that contain trans fats are stick margarine, some tub margarines, cookies, crackers, and other baked goods. If you are going to avoid a fat, this is the one to avoid!  WHAT GENERAL GUIDELINES DO I NEED TO FOLLOW?  Check food labels carefully to identify foods with trans fats. Avoid these types of options when possible.  Fill one half of your plate with vegetables and green salads. Eat 4-5 servings of vegetables per day. A serving of vegetables equals 1 cup of raw leafy vegetables,  cup of raw or cooked cut-up vegetables, or  cup of vegetable juice.  Fill one fourth of your  plate with whole grains. Look for the word "whole" as the first word in the ingredient list.  Fill one fourth of your plate with lean protein foods.  Eat 4-5 servings of fruit per day. A serving of fruit equals one medium whole fruit,  cup of dried fruit,  cup of fresh, frozen, or canned fruit. Try to avoid fruits in cups/syrups as the sugar content can be high.  Eat more foods that contain soluble fiber. Examples of foods that contain this type of fiber are apples, broccoli, carrots, beans, peas, and barley. Aim to get 20-30 g of fiber per day.  Eat more home-cooked food and less restaurant, buffet, and fast food.  Limit or avoid alcohol.  Limit foods that are high in starch and sugar.  Avoid fried foods when able.  Cook foods by using methods other than frying. Baking, boiling, grilling, and broiling are all great options. Other fat-reducing suggestions include: ? Removing the skin from poultry. ? Removing all visible fats from meats. ? Skimming the fat off of stews, soups, and gravies before serving them. ? Steaming vegetables in water or broth.  Lose weight if you are overweight. Losing just 5-10% of your initial body weight can help your overall health and prevent diseases such as diabetes and heart disease.  Increase your consumption of nuts, legumes, and seeds to 4-5 servings per week. One serving of dried beans or legumes equals  cup after being cooked, one serving of nuts equals 1 ounces, and one serving of seeds equals  ounce or 1 tablespoon.  WHAT ARE GOOD FOODS CAN I EAT? Grains Grainy breads (try to find bread that is 3 g of fiber per slice or greater), oatmeal, light popcorn. Whole-grain cereals. Rice and pasta, including brown rice and those that are made with whole wheat. Edamame pasta is a great alternative to grain pasta. It has a higher protein content. Try to avoid significant consumption of white bread, sugary cereals, or pastries/baked goods.  Vegetables All  vegetables. Cooked white potatoes do not count as vegetables.  Fruits All fruits, but limit pineapple and bananas as these fruits have a higher sugar content.  Meats and Other Protein Sources Lean, well-trimmed beef, veal, pork, and lamb. Chicken and [place] without skin. All fish and shellfish. Wild duck, rabbit, pheasant, and venison. Egg whites or low-cholesterol egg substitutes. Dried beans, peas, lentils, and tofu.Seeds and most nuts.  Dairy Low-fat or nonfat cheeses, including ricotta, string, and mozzarella. Skim or 1% milk that is liquid, powdered, or evaporated. Buttermilk that is made with low-fat milk. Nonfat or low-fat yogurt. Soy/Almond milk are good alternatives if you cannot handle dairy.  Beverages Water is the best for you. Sports drinks with less sugar are more desirable unless you are a highly active athlete.  Sweets and Desserts Sherbets and fruit ices. Honey, jam, marmalade, jelly, and syrups. Dark chocolate.  Eat all sweets and desserts in moderation.  Fats and Oils Nonhydrogenated (trans-free) margarines. Vegetable oils, including soybean, sesame, sunflower, olive, peanut, safflower, corn, canola, and cottonseed. Salad dressings or mayonnaise that are made with a vegetable oil. Limit added fats and oils that you use for cooking, baking, salads, and as spreads.  Other Cocoa powder. Coffee and tea. Most condiments.  The items listed above may not be a complete list of recommended foods or beverages. Contact your dietitian for more options.

## 2020-07-06 NOTE — Progress Notes (Signed)
Chief Complaint  Patient presents with  . Annual Exam    Subjective: Patient is a 34 y.o. male here for f/u.  Patient is planning on donating a kidney.  He is questioning if he needs blood work.  He needs his height, weight, blood pressure and writing as well as our address and my name.  He is having this done through [facility]  He reports that the recipient has insurance where they will cover all his costs and their family will provide any other out-of-pocket expenses incurred.  Patient walks 7 miles 6 days a week.  Appetite/diet is healthy overall.  He is up-to-date with his vaccinations.  Past Medical History:  Diagnosis Date  . Allergy   . Environmental allergies     Objective: BP 120/80 (BP Location: Left Arm, Patient Position: Sitting, Cuff Size: Large)   Pulse 87   Temp 98.1 F (36.7 C) (Oral)   Ht 6\' 2"  (1.88 m)   Wt 278 lb 6 oz (126.3 kg)   SpO2 97%   BMI 35.74 kg/m  General: Awake, appears stated age HEENT: MMM, EOMi Heart: RRR, no lower extremity edema Lungs: CTAB, no rales, wheezes or rhonchi. No accessory muscle use Neuro: DTRs equal and symmetric in the lower extremities without clonus, gait is normal Abdomen: Soft, nontender, nondistended, no masses or organomegaly Psych: Age appropriate judgment and insight, normal affect and mood  Assessment and Plan: Obesity (BMI 30-39.9)  Willing to be kidney donor  Information requested was provided today.  Counseled on diet and exercise.  He will let know if he needs anything from our office regarding this donation.  I see no medical contraindication for him undergoing evaluation for organ donation. Follow-up in 1 year for physical if he has insurance. The patient voiced understanding and agreement to the plan.  [place], DO 07/06/20  8:05 AM

## 2020-11-27 ENCOUNTER — Encounter (HOSPITAL_BASED_OUTPATIENT_CLINIC_OR_DEPARTMENT_OTHER): Payer: Self-pay | Admitting: *Deleted

## 2020-11-27 ENCOUNTER — Emergency Department (HOSPITAL_BASED_OUTPATIENT_CLINIC_OR_DEPARTMENT_OTHER)
Admission: EM | Admit: 2020-11-27 | Discharge: 2020-11-27 | Disposition: A | Discharge status: Home / Self Care | Payer: Self-pay | Attending: Emergency Medicine | Admitting: Emergency Medicine

## 2020-11-27 ENCOUNTER — Other Ambulatory Visit: Payer: Self-pay

## 2020-11-27 DIAGNOSIS — T148XXA Other injury of unspecified body region, initial encounter: Secondary | ICD-10-CM

## 2020-11-27 DIAGNOSIS — X500XXA Overexertion from strenuous movement or load, initial encounter: Secondary | ICD-10-CM | POA: Insufficient documentation

## 2020-11-27 DIAGNOSIS — M5442 Lumbago with sciatica, left side: Secondary | ICD-10-CM | POA: Insufficient documentation

## 2020-11-27 DIAGNOSIS — M5432 Sciatica, left side: Secondary | ICD-10-CM

## 2020-11-27 DIAGNOSIS — S76312A Strain of muscle, fascia and tendon of the posterior muscle group at thigh level, left thigh, initial encounter: Secondary | ICD-10-CM | POA: Insufficient documentation

## 2020-11-27 MED ORDER — KETOROLAC TROMETHAMINE 60 MG/2ML IM SOLN
30.0000 mg | Freq: Once | INTRAMUSCULAR | Status: AC
  Administered 2020-11-27: 30 mg via INTRAMUSCULAR
  Filled 2020-11-27: qty 2

## 2020-11-27 NOTE — ED Triage Notes (Signed)
Left sided lower back pain radiating down left buttock into left leg. Denies injury. Ambulatory.

## 2020-11-27 NOTE — ED Provider Notes (Signed)
[facility] EMERGENCY DEPARTMENT Provider Note  CSN: [id number] Arrival date & time: 11/27/20 2017  Chief Complaint(s) Leg Pain  HPI Ario Mcdiarmid is a 34 y.o. male   The history is provided by the patient.  Back Pain Location:  Lumbar spine and sacro-iliac joint Quality:  Cramping and aching Radiates to:  L thigh Pain severity:  Moderate Onset quality:  Gradual Timing:  Constant Progression:  Waxing and waning Chronicity:  New Context: lifting heavy objects   Relieved by:  Being still Worsened by:  Palpation, touching, twisting, movement and bending Ineffective treatments:  OTC medications Associated symptoms: no bladder incontinence, no bowel incontinence, no numbness, no paresthesias, no perianal numbness and no weakness   Risk factors: no hx of cancer     Past Medical History Past Medical History:  Diagnosis Date  . Allergy   . Environmental allergies    There are no problems to display for this patient.  Home Medication(s) Prior to Admission medications   Medication Sig Start Date End Date Taking? Authorizing Provider  ibuprofen (ADVIL,MOTRIN) 600 MG tablet Take 1 tablet (600 mg total) by mouth every 6 (six) hours as needed for moderate pain. 12/23/17   Fayrene Helper, PA-C                                                                                                                                    Past Surgical History Past Surgical History:  Procedure Laterality Date  . NO PAST SURGERIES     Family History Family History  Problem Relation Age of Onset  . Hypertension Mother   . Cancer Father        non hodgins lymphoma  . Diabetes Maternal Aunt   . Cancer Maternal Uncle        pancreatic cancer  . Cancer Maternal Grandmother        pancreatic cancer    Social History Social History   Tobacco Use  . Smoking status: Never Smoker  . Smokeless tobacco: Never Used  Substance Use Topics  . Alcohol use: Not Currently  . Drug use: No    Allergies Sulfa antibiotics  Review of Systems Review of Systems  Gastrointestinal: Negative for bowel incontinence.  Genitourinary: Negative for bladder incontinence.  Musculoskeletal: Positive for back pain.  Neurological: Negative for weakness, numbness and paresthesias.   All other systems are reviewed and are negative for acute change except as noted in the HPI  Physical Exam Vital Signs  I have reviewed the triage vital signs BP 130/87 (BP Location: Left Arm)   Pulse 83   Temp 98.6 F (37 C) (Oral)   Resp 18   Ht 6\' 2"  (1.88 m)   Wt 113.4 kg   SpO2 98%   BMI 32.10 kg/m   Physical Exam Vitals reviewed.  Constitutional:      General: He is not in acute distress.    Appearance: He is  well-developed. He is not diaphoretic.  HENT:     Head: Normocephalic and atraumatic.     Jaw: No trismus.     Right Ear: External ear normal.     Left Ear: External ear normal.     Nose: Nose normal.  Eyes:     General: No scleral icterus.    Conjunctiva/sclera: Conjunctivae normal.  Neck:     Trachea: Phonation normal.  Cardiovascular:     Rate and Rhythm: Normal rate and regular rhythm.  Pulmonary:     Effort: Pulmonary effort is normal. No respiratory distress.     Breath sounds: No stridor.  Abdominal:     General: There is no distension.  Musculoskeletal:        General: Normal range of motion.     Cervical back: Normal range of motion.     Lumbar back: Tenderness present. No bony tenderness.       Back:  Neurological:     Mental Status: He is alert and oriented to person, place, and time.     Comments: Spine Exam: Strength: 5/5 throughout LE bilaterally (hip flexion/extension, adduction/abduction; knee flexion/extension; foot dorsiflexion/plantarflexion, inversion/eversion; great toe inversion) Sensation: Intact to light touch in proximal and distal LE bilaterally Reflexes: 1+ quadriceps and achilles reflexes   Psychiatric:        Behavior: Behavior normal.      ED Results and Treatments Labs (all labs ordered are listed, but only abnormal results are displayed) Labs Reviewed - No data to display                                                                                                                       EKG  EKG Interpretation  Date/Time:    Ventricular Rate:    PR Interval:    QRS Duration:   QT Interval:    QTC Calculation:   R Axis:     Text Interpretation:        Radiology No results found.  Pertinent labs & imaging results that were available during my care of the patient were reviewed by me and considered in my medical decision making (see chart for details).  Medications Ordered in ED Medications  ketorolac (TORADOL) injection 30 mg (has no administration in time range)  Procedures Procedures  (including critical care time)  Medical Decision Making / ED Course I have reviewed the nursing notes for this encounter and the patient's prior records (if available in EHR or on provided paperwork).   Cipriano Millikan was evaluated in Emergency Department on 11/27/2020 for the symptoms described in the history of present illness. He was evaluated in the context of the global COVID-19 pandemic, which necessitated consideration that the patient might be at risk for infection with the SARS-CoV-2 virus that causes COVID-19. Institutional protocols and algorithms that pertain to the evaluation of patients at risk for COVID-19 are in a state of rapid change based on information released by regulatory bodies including the CDC and federal and state organizations. These policies and algorithms were followed during the patient's care in the ED.  34 y.o. male presents with back pain in lumbar area for 4 days with signs of radicular pain. No acute traumatic onset. No red flag symptoms of fever, weight loss,  saddle anesthesia, weakness, fecal/urinary incontinence or urinary retention.   Suspect MSK etiology. No indication for imaging emergently. Patient was recommended to take short course of scheduled NSAIDs and engage in early mobility as definitive treatment. Return precautions discussed for worsening or new concerning symptoms.        Final Clinical Impression(s) / ED Diagnoses Final diagnoses:  Muscle strain  Sciatica of left side    The patient appears reasonably screened and/or stabilized for discharge and I doubt any other medical condition or other [facility] requiring further screening, evaluation, or treatment in the ED at this time prior to discharge. Safe for discharge with strict return precautions.  Disposition: Discharge  Condition: Good  I have discussed the results, Dx and Tx plan with the patient/family who expressed understanding and agree(s) with the plan. Discharge instructions discussed at length. The patient/family was given strict return precautions who verbalized understanding of the instructions. No further questions at time of discharge.    ED Discharge Orders    None       Follow Up: Sharlene Dory, DO [address] [place] [address] [phone]  Call  To schedule an appointment for close follow up     This chart was dictated using voice recognition software.  Despite best efforts to proofread,  errors can occur which can change the documentation meaning.   Nira Conn, MD 11/27/20 2316

## 2020-11-27 NOTE — Discharge Instructions (Signed)
You may use over-the-counter Motrin (Ibuprofen), Acetaminophen (Tylenol), topical muscle creams such as SalonPas, Icy Hot, Bengay, etc. Please stretch, apply heat, and have massage therapy for additional assistance. ° °

## 2021-07-08 ENCOUNTER — Other Ambulatory Visit: Payer: Self-pay

## 2021-07-08 ENCOUNTER — Encounter: Payer: Self-pay | Admitting: Family Medicine

## 2021-07-08 ENCOUNTER — Ambulatory Visit (INDEPENDENT_AMBULATORY_CARE_PROVIDER_SITE_OTHER): Payer: Self-pay | Admitting: Family Medicine

## 2021-07-08 ENCOUNTER — Other Ambulatory Visit: Payer: Self-pay | Admitting: Family Medicine

## 2021-07-08 VITALS — BP 124/80 | HR 96 | Temp 98.0°F | Ht 74.0 in | Wt 310.1 lb

## 2021-07-08 DIAGNOSIS — Z114 Encounter for screening for human immunodeficiency virus [HIV]: Secondary | ICD-10-CM

## 2021-07-08 DIAGNOSIS — F5232 Male orgasmic disorder: Secondary | ICD-10-CM

## 2021-07-08 DIAGNOSIS — R635 Abnormal weight gain: Secondary | ICD-10-CM

## 2021-07-08 LAB — COMPREHENSIVE METABOLIC PANEL
ALT: 45 U/L (ref 0–53)
AST: 21 U/L (ref 0–37)
Albumin: 4.6 g/dL (ref 3.5–5.2)
Alkaline Phosphatase: 49 U/L (ref 39–117)
BUN: 15 mg/dL (ref 6–23)
CO2: 28 mEq/L (ref 19–32)
Calcium: 10.1 mg/dL (ref 8.4–10.5)
Chloride: 104 mEq/L (ref 96–112)
Creatinine, Ser: 1.02 mg/dL (ref 0.40–1.50)
GFR: 97.19 mL/min (ref >60.00)
Glucose, Bld: 91 mg/dL (ref 70–99)
Potassium: 4 mEq/L (ref 3.5–5.1)
Sodium: 140 mEq/L (ref 135–145)
Total Bilirubin: 0.4 mg/dL (ref 0.2–1.2)
Total Protein: 7.3 g/dL (ref 6.0–8.3)

## 2021-07-08 LAB — HEMOGLOBIN A1C: Hgb A1c MFr Bld: 5.4 % (ref 4.6–6.5)

## 2021-07-08 LAB — LIPID PANEL
Cholesterol: 233 mg/dL — ABNORMAL HIGH (ref 0–200)
HDL: 38.7 mg/dL — ABNORMAL LOW (ref >39.00)
LDL Cholesterol: 163 mg/dL — ABNORMAL HIGH (ref 0–99)
NonHDL: 194.19
Total CHOL/HDL Ratio: 6
Triglycerides: 158 mg/dL — ABNORMAL HIGH (ref 0.0–149.0)
VLDL: 31.6 mg/dL (ref 0.0–40.0)

## 2021-07-08 LAB — TESTOSTERONE: Testosterone: 341.95 ng/dL (ref 300.00–890.00)

## 2021-07-08 LAB — TSH: TSH: 1.13 u[IU]/mL (ref 0.35–5.50)

## 2021-07-08 MED ORDER — PHENTERMINE HCL 37.5 MG PO CAPS: 37.5000 mg | ORAL_CAPSULE | ORAL | 0 refills | Status: DC

## 2021-07-08 NOTE — Progress Notes (Signed)
Chief Complaint  Patient presents with   Annual Exam    Subjective: Patient is a 35 y.o. male here for a CPE but he has no ins currently.  Has gained around 60 lbs in the last 6 mo. 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 Diet is fair, but due to caregiver duties, it can sometimes be sporadic. He is interested in a medication to help him lose weight.   Over the past several mo, even prior to him becoming a caregiver, he has had issues with ejaculation. He will engage in sexual activity but not orgasm. He will dc pre-seminal fluid, but nothing beyond that. Libido is fine.   Past Medical History:  Diagnosis Date   Allergy    Environmental allergies     Objective: BP 124/80   Pulse 96   Temp 98 F (36.7 C) (Oral)   Ht 6\' 2"  (1.88 m)   Wt (!) 310 lb 2 oz (140.7 kg)   SpO2 97%   BMI 39.82 kg/m  General: Awake, appears stated age HEENT: MMM Heart: RRR, no LE edema  Lungs: CTAB, no rales, wheezes or rhonchi. No accessory muscle use Psych: Age appropriate judgment and insight, normal affect and mood  Assessment and Plan: Weight gain - Plan: Lipid panel, Hemoglobin A1c, Comprehensive metabolic panel, TSH, Testosterone  Screening for HIV without presence of risk factors - Plan: HIV Antibody (routine testing w rflx)  Chronic, worsening. Will ck labs, if nml will trial Rybelsus. In the likely event this is not affordable, will trial Adipex w reck in 1 mo. New problem, uncertain prog. Ck labs. If T WNL, will offer referral to urology. He is young and not on an alpha blocker. The patient voiced understanding and agreement to the plan.  [place], DO 07/08/21  7:29 AM

## 2021-07-08 NOTE — Patient Instructions (Addendum)
Give [place] 2-3 business days to get the results of your labs back. If labs are normal, I will send in a medicine. There's a decent chance it will be quite expensive. If it is, do not fill it and let me know. I will send in an alternative.   Keep the diet clean and stay active.  Aim to do some physical exertion for 150 minutes per week. This is typically divided into 5 days per week, 30 minutes per day. The activity should be enough to get your heart rate up. Anything is better than nothing if you have time constraints.  Do monthly self testicular checks in the shower. You are feeling for lumps/bumps that don't belong. If you feel anything like this, let me know!  Let us know if you need anything.

## 2021-07-09 ENCOUNTER — Telehealth: Payer: Self-pay | Admitting: Family Medicine

## 2021-07-09 LAB — HIV ANTIBODY (ROUTINE TESTING W REFLEX): HIV 1&2 Ab, 4th Generation: NONREACTIVE

## 2021-07-09 NOTE — Telephone Encounter (Signed)
Patient would like to know if is ok for him to continue to take Ginger,lemon & cayenne water along with the diet pills

## 2021-07-09 NOTE — Telephone Encounter (Signed)
Patient return your call °

## 2021-07-09 NOTE — Telephone Encounter (Signed)
Patient informed. 

## 2021-07-09 NOTE — Telephone Encounter (Signed)
Yes

## 2021-07-09 NOTE — Telephone Encounter (Signed)
Called left message to call back 

## 2021-08-07 ENCOUNTER — Ambulatory Visit (INDEPENDENT_AMBULATORY_CARE_PROVIDER_SITE_OTHER): Payer: Self-pay | Admitting: Family Medicine

## 2021-08-07 ENCOUNTER — Other Ambulatory Visit: Payer: Self-pay

## 2021-08-07 ENCOUNTER — Encounter: Payer: Self-pay | Admitting: Family Medicine

## 2021-08-07 VITALS — BP 124/82 | HR 92 | Temp 97.0°F | Ht 74.0 in | Wt 297.2 lb

## 2021-08-07 DIAGNOSIS — E669 Obesity, unspecified: Secondary | ICD-10-CM

## 2021-08-07 DIAGNOSIS — E782 Mixed hyperlipidemia: Secondary | ICD-10-CM

## 2021-08-07 LAB — LIPID PANEL
Cholesterol: 206 mg/dL — ABNORMAL HIGH (ref 0–200)
HDL: 39.3 mg/dL (ref >39.00)
LDL Cholesterol: 136 mg/dL — ABNORMAL HIGH (ref 0–99)
NonHDL: 166.78
Total CHOL/HDL Ratio: 5
Triglycerides: 152 mg/dL — ABNORMAL HIGH (ref 0.0–149.0)
VLDL: 30.4 mg/dL (ref 0.0–40.0)

## 2021-08-07 MED ORDER — PHENTERMINE HCL 37.5 MG PO CAPS: 37.5000 mg | ORAL_CAPSULE | ORAL | 0 refills | Status: DC

## 2021-08-07 NOTE — Progress Notes (Signed)
Chief Complaint  Patient presents with   Follow-up    Cholesterol and weight loss    Subjective: Patient is a 35 y.o. male here for f/u.  Pt started walking more, diet is improving. He has 13 lbs, 20 lbs on his scale. Tolerating Phentermine 37.5 mg/d. No AE's, compliant.   Mixed hyperlipidemia Dietary changes as above. Elevated TG's and LDL. Not on statin. No hx of CAD.   Past Medical History:  Diagnosis Date   Allergy    Environmental allergies    Objective: BP 124/82   Pulse 92   Temp (!) 97 F (36.1 C) (Oral)   Ht 6\' 2"  (1.88 m)   Wt 297 lb 4 oz (134.8 kg)   SpO2 93%   BMI 38.16 kg/m  General: Awake, appears stated age Heart: RRR Lungs: CTAB, no rales, wheezes or rhonchi. No accessory muscle use Psych: Age appropriate judgment and insight, normal affect and mood  Assessment and Plan: No diagnosis found.  Chronic, stable. Cont 2 more mo of phentermine 37.5 mg/d. BP and pulse stable. Chronic, either stable or unstable. Checking labs today. Would assume better. Hopefully can hold off on statin. Will f/u in Jan for CPE when he has ins.  The patient voiced understanding and agreement to the plan.  Feb [place]offe[place], DO 08/07/21  7:27 AM

## 2021-08-07 NOTE — Patient Instructions (Signed)
Give [place] 2-3 business days to get the results of your labs back.   Keep the diet clean and stay active.  When this current prescription ends, just call the pharmacy and they will have another order ready.  Very strong work with your weight loss.  Let us know if you need anything.

## 2021-08-21 ENCOUNTER — Telehealth: Payer: Self-pay

## 2021-08-21 NOTE — Telephone Encounter (Signed)
Pt requesting recent labs be mailed to home. Pt's address verified.

## 2021-08-21 NOTE — Telephone Encounter (Signed)
Labs printed and mailed to the patient.

## 2021-08-27 ENCOUNTER — Encounter (HOSPITAL_BASED_OUTPATIENT_CLINIC_OR_DEPARTMENT_OTHER): Payer: Self-pay

## 2021-08-27 ENCOUNTER — Emergency Department (HOSPITAL_BASED_OUTPATIENT_CLINIC_OR_DEPARTMENT_OTHER)
Admission: EM | Admit: 2021-08-27 | Discharge: 2021-08-27 | Disposition: A | Discharge status: Home / Self Care | Payer: Self-pay | Attending: Emergency Medicine | Admitting: Emergency Medicine

## 2021-08-27 ENCOUNTER — Emergency Department (HOSPITAL_BASED_OUTPATIENT_CLINIC_OR_DEPARTMENT_OTHER): Payer: Self-pay

## 2021-08-27 ENCOUNTER — Other Ambulatory Visit: Payer: Self-pay

## 2021-08-27 ENCOUNTER — Other Ambulatory Visit (HOSPITAL_BASED_OUTPATIENT_CLINIC_OR_DEPARTMENT_OTHER): Payer: Self-pay

## 2021-08-27 DIAGNOSIS — W228XXA Striking against or struck by other objects, initial encounter: Secondary | ICD-10-CM | POA: Insufficient documentation

## 2021-08-27 DIAGNOSIS — M5432 Sciatica, left side: Secondary | ICD-10-CM | POA: Insufficient documentation

## 2021-08-27 DIAGNOSIS — Z23 Encounter for immunization: Secondary | ICD-10-CM | POA: Insufficient documentation

## 2021-08-27 DIAGNOSIS — S90112A Contusion of left great toe without damage to nail, initial encounter: Secondary | ICD-10-CM | POA: Insufficient documentation

## 2021-08-27 MED ORDER — METHYLPREDNISOLONE 4 MG PO TBPK
ORAL_TABLET | ORAL | 0 refills | Status: DC
  Filled 2021-08-27: qty 21, 6d supply, fill #0

## 2021-08-27 MED ORDER — TETANUS-DIPHTH-ACELL PERTUSSIS 5-2.5-18.5 LF-MCG/0.5 IM SUSY
0.5000 mL | PREFILLED_SYRINGE | Freq: Once | INTRAMUSCULAR | Status: AC
  Administered 2021-08-27: 0.5 mL via INTRAMUSCULAR
  Filled 2021-08-27: qty 0.5

## 2021-08-27 MED ORDER — CYCLOBENZAPRINE HCL 10 MG PO TABS
10.0000 mg | ORAL_TABLET | Freq: Two times a day (BID) | ORAL | 0 refills | Status: DC | PRN
  Filled 2021-08-27: qty 20, 10d supply, fill #0

## 2021-08-27 NOTE — ED Triage Notes (Signed)
Pt states he opened the door and scrapped the top and bottom of his left foot and big toe. Small abrasion noted, no bleeding at this time. NAD in triage.

## 2021-08-27 NOTE — Discharge Instructions (Addendum)
X-ray showed no fracture.  Suspect bone bruise.  Recommend Tylenol and ibuprofen and ice as needed for pain.  Take Medrol Dosepak and Flexeril for your back pain.  Follow-up with your primary care doctor.  Use the postop shoe to help with comfort as well.

## 2021-08-27 NOTE — ED Notes (Signed)
PT had multiple questions about med's and follow ups and referred to D/C papers and to ask nurse when going over paperwork. PT given RICE protocols and had no further questions pertaining to post op shoe

## 2021-08-27 NOTE — ED Provider Notes (Signed)
[facility] EMERGENCY DEPARTMENT Provider Note   CSN: [id number] Arrival date & time: 08/27/21  1048     History Chief Complaint  Patient presents with   Foot Pain    Phillip Coleman is a 35 y.o. male.  Patient stubbed his left toe prior to arrival.  Pulled a small splinter out of his toe.  Also having some left-sided sciatic pain.  No loss of bowel or bladder.  Trauma.  No fevers.  The history is provided by the patient.  Foot Pain This is a new problem. The current episode started less than 1 hour ago. The problem occurs constantly. The problem has not changed since onset.Pertinent negatives include no chest pain, no abdominal pain and no shortness of breath. Nothing aggravates the symptoms. Nothing relieves the symptoms. He has tried nothing for the symptoms. The treatment provided no relief.      Past Medical History:  Diagnosis Date   Allergy    Environmental allergies     There are no problems to display for this patient.   Past Surgical History:  Procedure Laterality Date   NO PAST SURGERIES         Family History  Problem Relation Age of Onset   Hypertension Mother    Cancer Father        non hodgins lymphoma   Diabetes Maternal Aunt    Cancer Maternal Uncle        pancreatic cancer   Cancer Maternal Grandmother        pancreatic cancer    Social History   Tobacco Use   Smoking status: Never   Smokeless tobacco: Never  Substance Use Topics   Alcohol use: Not Currently   Drug use: No    Home Medications Prior to Admission medications   Medication Sig Start Date End Date Taking? Authorizing Provider  cyclobenzaprine (FLEXERIL) 10 MG tablet Take 1 tablet (10 mg total) by mouth 2 (two) times daily as needed for muscle spasms. 08/27/21  Yes Toshie Demelo, DO  methylPREDNISolone (MEDROL DOSEPAK) 4 MG TBPK tablet Take as directed, see back of pack for directions 08/27/21  Yes Moniqua Engebretsen, DO  ibuprofen (ADVIL,MOTRIN) 600 MG tablet Take 1  tablet (600 mg total) by mouth every 6 (six) hours as needed for moderate pain. 12/23/17   Fayrene Helper, PA-C  phentermine 37.5 MG capsule Take 1 capsule (37.5 mg total) by mouth every morning. 08/07/21   Sharlene Dory, DO  phentermine 37.5 MG capsule Take 1 capsule (37.5 mg total) by mouth every morning. 09/06/21   Sharlene Dory, DO    Allergies    Sulfa antibiotics  Review of Systems   Review of Systems  Constitutional:  Negative for chills and fever.  HENT:  Negative for ear pain and sore throat.   Eyes:  Negative for pain and visual disturbance.  Respiratory:  Negative for cough and shortness of breath.   Cardiovascular:  Negative for chest pain and palpitations.  Gastrointestinal:  Negative for abdominal pain and vomiting.  Genitourinary:  Negative for dysuria and hematuria.  Musculoskeletal:  Positive for arthralgias (left toe pain) and back pain.  Skin:  Positive for wound. Negative for color change and rash.  Neurological:  Negative for seizures and syncope.  All other systems reviewed and are negative.  Physical Exam Updated Vital Signs  ED Triage Vitals  Enc Vitals Group     BP 08/27/21 1101 (!) 149/100     Pulse Rate 08/27/21 1059 90  Resp 08/27/21 1059 16     Temp 08/27/21 1059 98.7 F (37.1 C)     Temp Source 08/27/21 1059 Oral     SpO2 08/27/21 1056 100 %     Weight 08/27/21 1057 271 lb (122.9 kg)     Height 08/27/21 1057 6\' 2"  (1.88 m)     Head Circumference --      Peak Flow --      Pain Score 08/27/21 1056 6     Pain Loc --      Pain Edu? --      Excl. in GC? --      Physical Exam Vitals and nursing note reviewed.  Constitutional:      Appearance: He is well-developed.  Cardiovascular:     Rate and Rhythm: Normal rate and regular rhythm.     Pulses: Normal pulses.  Musculoskeletal:        General: Tenderness present. Normal range of motion.     Cervical back: Neck supple.     Comments: No midline spinal pain, tenderness to  paraspinal lumbar muscles on the left, tenderness to the left toe  Skin:    General: Skin is warm and dry.     Comments: Small puncture wound to the left big toe/abrasion  Neurological:     General: No focal deficit present.     Mental Status: He is alert.     Sensory: No sensory deficit.     Motor: No weakness.    ED Results / Procedures / Treatments   Labs (all labs ordered are listed, but only abnormal results are displayed) Labs Reviewed - No data to display  EKG None  Radiology DG Foot Complete Left  Result Date: 08/27/2021 CLINICAL DATA:  Left foot injury. EXAM: LEFT FOOT - COMPLETE 3+ VIEW COMPARISON:  None. FINDINGS: There is no evidence of fracture or dislocation. There is no evidence of arthropathy or other focal bone abnormality. Soft tissues are unremarkable. IMPRESSION: Negative. Electronically Signed   By: 10/27/2021 M.D.   On: 08/27/2021 12:13    Procedures Procedures   Medications Ordered in ED Medications  Tdap (BOOSTRIX) injection 0.5 mL (0.5 mLs Intramuscular Given 08/27/21 1158)    ED Course  I have reviewed the triage vital signs and the nursing notes.  Pertinent labs & imaging results that were available during my care of the patient were reviewed by me and considered in my medical decision making (see chart for details).    MDM Rules/Calculators/A&P                           Phillip Coleman is here with left big toe pain after stubbing his big toe.  He removed a piece of wood/splinter from his big toe.  There is a mild abrasion.  No infectious process.  X-ray negative for fracture.  He has been also having some sciatic pain.  Left lower back pain.  No symptoms consistent with cauda equina.  Exam is overall unremarkable except for some tenderness in the paraspinal lumbar muscles on the left.  Will prescribe Medrol Dosepak and Flexeril.  Recommend ibuprofen and Tylenol as well.  Tetanus shot was updated.  Placed in a postop shoe for comfort.  Recommend  bacitracin/Neosporin to toe abrasion.  Discharged in good condition.  Understands return precautions.  Neurovascular neuromuscularly intact.  This chart was dictated using voice recognition software.  Despite best efforts to proofread,  errors can occur which  can change the documentation meaning.   Final Clinical Impression(s) / ED Diagnoses Final diagnoses:  Sciatica of left side  Contusion of left great toe without damage to nail, initial encounter    Rx / DC Orders ED Discharge Orders          Ordered    methylPREDNISolone (MEDROL DOSEPAK) 4 MG TBPK tablet        08/27/21 1151    cyclobenzaprine (FLEXERIL) 10 MG tablet  2 times daily PRN        08/27/21 1151             Chevette Fee, Madelaine Bhat, DO 08/27/21 1217

## 2021-09-06 ENCOUNTER — Telehealth: Payer: Self-pay

## 2021-09-06 NOTE — Telephone Encounter (Signed)
Pt called in states that he went to Er last Tuesday for he hit his foot and then got a toothpick stuck it. Pt states that he they put a boot on him due to his foot being bruised, also they gave him antibiotic. Pt would states that he is on weight loss medication and he couldn't walk due to the boot like he usually did. Pt wants to know if it is ok to take off boot? Pt would like to know if his weight loss medication can be sent in? And also how would the weight loss medication work without him being able to to walk?

## 2021-09-06 NOTE — Telephone Encounter (Signed)
Patient informed of PCP instructions. He verbalized understanding. 

## 2021-09-06 NOTE — Telephone Encounter (Signed)
Yes if pain free to walk. No, we only do 3 months worth. It works exactly the same if he doesn't walk. Ty.

## 2022-02-07 ENCOUNTER — Encounter: Payer: Self-pay | Admitting: Family Medicine

## 2022-02-07 ENCOUNTER — Ambulatory Visit (INDEPENDENT_AMBULATORY_CARE_PROVIDER_SITE_OTHER): Payer: Self-pay | Admitting: Family Medicine

## 2022-02-07 ENCOUNTER — Telehealth: Payer: Self-pay | Admitting: Family Medicine

## 2022-02-07 VITALS — BP 118/78 | HR 90 | Temp 98.0°F | Ht 74.0 in | Wt 291.0 lb

## 2022-02-07 DIAGNOSIS — Z0000 Encounter for general adult medical examination without abnormal findings: Secondary | ICD-10-CM

## 2022-02-07 DIAGNOSIS — Z114 Encounter for screening for human immunodeficiency virus [HIV]: Secondary | ICD-10-CM

## 2022-02-07 LAB — COMPREHENSIVE METABOLIC PANEL
ALT: 32 U/L (ref 0–53)
AST: 18 U/L (ref 0–37)
Albumin: 4.7 g/dL (ref 3.5–5.2)
Alkaline Phosphatase: 48 U/L (ref 39–117)
BUN: 17 mg/dL (ref 6–23)
CO2: 31 mEq/L (ref 19–32)
Calcium: 10.2 mg/dL (ref 8.4–10.5)
Chloride: 105 mEq/L (ref 96–112)
Creatinine, Ser: 1.15 mg/dL (ref 0.40–1.50)
GFR: 83.82 mL/min (ref >60.00)
Glucose, Bld: 90 mg/dL (ref 70–99)
Potassium: 4.2 mEq/L (ref 3.5–5.1)
Sodium: 140 mEq/L (ref 135–145)
Total Bilirubin: 0.6 mg/dL (ref 0.2–1.2)
Total Protein: 7.3 g/dL (ref 6.0–8.3)

## 2022-02-07 LAB — CBC
HCT: 44.8 % (ref 39.0–52.0)
Hemoglobin: 15.2 g/dL (ref 13.0–17.0)
MCHC: 33.8 g/dL (ref 30.0–36.0)
MCV: 94.2 fl (ref 78.0–100.0)
Platelets: 327 10*3/uL (ref 150.0–400.0)
RBC: 4.75 Mil/uL (ref 4.22–5.81)
RDW: 13.5 % (ref 11.5–15.5)
WBC: 8.1 10*3/uL (ref 4.0–10.5)

## 2022-02-07 LAB — LIPID PANEL
Cholesterol: 201 mg/dL — ABNORMAL HIGH (ref 0–200)
HDL: 38.8 mg/dL — ABNORMAL LOW (ref >39.00)
LDL Cholesterol: 139 mg/dL — ABNORMAL HIGH (ref 0–99)
NonHDL: 161.72
Total CHOL/HDL Ratio: 5
Triglycerides: 115 mg/dL (ref 0.0–149.0)
VLDL: 23 mg/dL (ref 0.0–40.0)

## 2022-02-07 NOTE — Telephone Encounter (Signed)
Mailed a copy 

## 2022-02-07 NOTE — Progress Notes (Signed)
Chief Complaint  Patient presents with   Follow-up    6 month    Well Male Phillip Coleman is here for a complete physical.   His last physical was >1 year ago.  Current diet: in general, a "healthy" diet.   Current exercise: walking Weight trend: stable Fatigue out of ordinary? No. Seat belt? Yes.   Advanced directive? No  Health maintenance Tetanus- Yes HIV- Yes Hep C- Yes  Past Medical History:  Diagnosis Date   Allergy    Environmental allergies      Past Surgical History:  Procedure Laterality Date   NO PAST SURGERIES      Medications  Takes no meds routinely.   Allergies Allergies  Allergen Reactions   Sulfa Antibiotics Hives    Family History Family History  Problem Relation Age of Onset   Hypertension Mother    Cancer Father        non hodgins lymphoma   Diabetes Maternal Aunt    Cancer Maternal Uncle        pancreatic cancer   Cancer Maternal Grandmother        pancreatic cancer    Review of Systems: Constitutional: no fevers or chills Eye:  no recent significant change in vision Ear/Nose/Mouth/Throat:  Ears:  no hearing loss Nose/Mouth/Throat:  no complaints of nasal congestion, no sore throat Cardiovascular:  no chest pain Respiratory:  no shortness of breath Gastrointestinal:  no abdominal pain, no change in bowel habits GU:  Male: negative for dysuria Musculoskeletal/Extremities:  no pain of the joints Integumentary (Skin/Breast):  no abnormal skin lesions reported Neurologic:  no headaches Endocrine: No unexpected weight changes Hematologic/Lymphatic:  no night sweats  Exam BP 118/78    Pulse 90    Temp 98 F (36.7 C) (Oral)    Ht 6\' 2"  (1.88 m)    Wt 291 lb (132 kg)    SpO2 95%    BMI 37.36 kg/m  General:  well developed, well nourished, in no apparent distress Skin:  no significant moles, warts, or growths Head:  no masses, lesions, or tenderness Eyes:  pupils equal and round, sclera anicteric without injection Ears:  canals  without lesions, TMs shiny without retraction, no obvious effusion, no erythema Nose:  nares patent, septum midline, mucosa normal Throat/Pharynx:  lips and gingiva without lesion; tongue and uvula midline; non-inflamed pharynx; no exudates or postnasal drainage Neck: neck supple without adenopathy, thyromegaly, or masses Lungs:  clear to auscultation, breath sounds equal bilaterally, no respiratory distress Cardio:  regular rate and rhythm, no bruits, no LE edema Abdomen:  abdomen soft, nontender; bowel sounds normal; no masses or organomegaly Genital (male): Deferred Rectal: Deferred Musculoskeletal:  symmetrical muscle groups noted without atrophy or deformity Extremities:  no clubbing, cyanosis, or edema, no deformities, no skin discoloration Neuro:  gait normal; deep tendon reflexes normal and symmetric Psych: well oriented with normal range of affect and appropriate judgment/insight  Assessment and Plan  Well adult exam - Plan: CBC, Comprehensive metabolic panel, Lipid panel  Screening for HIV (human immunodeficiency virus) - Plan: HIV Antibody (routine testing w rflx)   Well 36 y.o. male. Counseled on diet and exercise. Self testicular exams recommended at least monthly.  Advanced directive form provided today.  Other orders as above. Follow up in 1 year pending the above workup. The patient voiced understanding and agreement to the plan.  [address] [place], DO 02/07/22 7:45 AM

## 2022-02-07 NOTE — Telephone Encounter (Signed)
Patient would like his recent cholesterol labs mailed to him.

## 2022-02-07 NOTE — Patient Instructions (Addendum)
Give us 2-3 business days to get the results of your labs back.   Keep the diet clean and stay active.  Do monthly self testicular checks in the shower. You are feeling for lumps/bumps that don't belong. If you feel anything like this, let me know!  Let us know if you need anything.  Healthy Eating Plan Many factors influence your heart health, including eating and exercise habits. Heart (coronary) risk increases with abnormal blood fat (lipid) levels. Heart-healthy meal planning includes limiting unhealthy fats, increasing healthy fats, and making other small dietary changes. This includes maintaining a healthy body weight to help keep lipid levels within a normal range.  WHAT IS MY PLAN?  Your health care provider recommends that you: Drink a glass of water before meals to help with satiety. Eat slowly. An alternative to the water is to add Metamucil. This will help with satiety as well. It does contain calories, unlike water.  WHAT TYPES OF FAT SHOULD I CHOOSE? Choose healthy fats more often. Choose monounsaturated and polyunsaturated fats, such as olive oil and canola oil, flaxseeds, walnuts, almonds, and seeds. Eat more omega-3 fats. Good choices include salmon, mackerel, sardines, tuna, flaxseed oil, and ground flaxseeds. Aim to eat fish at least two times each week. Avoid foods with partially hydrogenated oils in them. These contain trans fats. Examples of foods that contain trans fats are stick margarine, some tub margarines, cookies, crackers, and other baked goods. If you are going to avoid a fat, this is the one to avoid!  WHAT GENERAL GUIDELINES DO I NEED TO FOLLOW? Check food labels carefully to identify foods with trans fats. Avoid these types of options when possible. Fill one half of your plate with vegetables and green salads. Eat 4-5 servings of vegetables per day. A serving of vegetables equals 1 cup of raw leafy vegetables,  cup of raw or cooked cut-up vegetables, or   cup of vegetable juice. Fill one fourth of your plate with whole grains. Look for the word "whole" as the first word in the ingredient list. Fill one fourth of your plate with lean protein foods. Eat 4-5 servings of fruit per day. A serving of fruit equals one medium whole fruit,  cup of dried fruit,  cup of fresh, frozen, or canned fruit. Try to avoid fruits in cups/syrups as the sugar content can be high. Eat more foods that contain soluble fiber. Examples of foods that contain this type of fiber are apples, broccoli, carrots, beans, peas, and barley. Aim to get 20-30 g of fiber per day. Eat more home-cooked food and less restaurant, buffet, and fast food. Limit or avoid alcohol. Limit foods that are high in starch and sugar. Avoid fried foods when able. Cook foods by using methods other than frying. Baking, boiling, grilling, and broiling are all great options. Other fat-reducing suggestions include: Removing the skin from poultry. Removing all visible fats from meats. Skimming the fat off of stews, soups, and gravies before serving them. Steaming vegetables in water or broth. Lose weight if you are overweight. Losing just 5-10% of your initial body weight can help your overall health and prevent diseases such as diabetes and heart disease. Increase your consumption of nuts, legumes, and seeds to 4-5 servings per week. One serving of dried beans or legumes equals  cup after being cooked, one serving of nuts equals 1 ounces, and one serving of seeds equals  ounce or 1 tablespoon.  WHAT ARE GOOD FOODS CAN I EAT? Grains Grainy   breads (try to find bread that is 3 g of fiber per slice or greater), oatmeal, light popcorn. Whole-grain cereals. Rice and pasta, including brown rice and those that are made with whole wheat. Edamame pasta is a great alternative to grain pasta. It has a higher protein content. Try to avoid significant consumption of white bread, sugary cereals, or pastries/baked  goods.  Vegetables All vegetables. Cooked white potatoes do not count as vegetables.  Fruits All fruits, but limit pineapple and bananas as these fruits have a higher sugar content.  Meats and Other Protein Sources Lean, well-trimmed beef, veal, pork, and lamb. Chicken and turkey without skin. All fish and shellfish. Wild duck, rabbit, pheasant, and venison. Egg whites or low-cholesterol egg substitutes. Dried beans, peas, lentils, and tofu. Seeds and most nuts.  Dairy Low-fat or nonfat cheeses, including ricotta, string, and mozzarella. Skim or 1% milk that is liquid, powdered, or evaporated. Buttermilk that is made with low-fat milk. Nonfat or low-fat yogurt. Soy/Almond milk are good alternatives if you cannot handle dairy.  Beverages Water is the best for you. Sports drinks with less sugar are more desirable unless you are a highly active athlete.  Sweets and Desserts Sherbets and fruit ices. Honey, jam, marmalade, jelly, and syrups. Dark chocolate.  Eat all sweets and desserts in moderation.  Fats and Oils Nonhydrogenated (trans-free) margarines. Vegetable oils, including soybean, sesame, sunflower, olive, peanut, safflower, corn, canola, and cottonseed. Salad dressings or mayonnaise that are made with a vegetable oil. Limit added fats and oils that you use for cooking, baking, salads, and as spreads.  Other Cocoa powder. Coffee and tea. Most condiments.  The items listed above may not be a complete list of recommended foods or beverages. Contact your dietitian for more options. 

## 2022-02-10 LAB — HIV ANTIBODY (ROUTINE TESTING W REFLEX): HIV 1&2 Ab, 4th Generation: NONREACTIVE

## 2022-02-17 IMAGING — CR DG FOOT COMPLETE 3+V*L*
3 series · 3 of 3 positions shown · non-contrast
Comparison: None.

CLINICAL DATA: Left foot injury.

EXAM:
LEFT FOOT - COMPLETE 3+ VIEW

[t foot ap left]
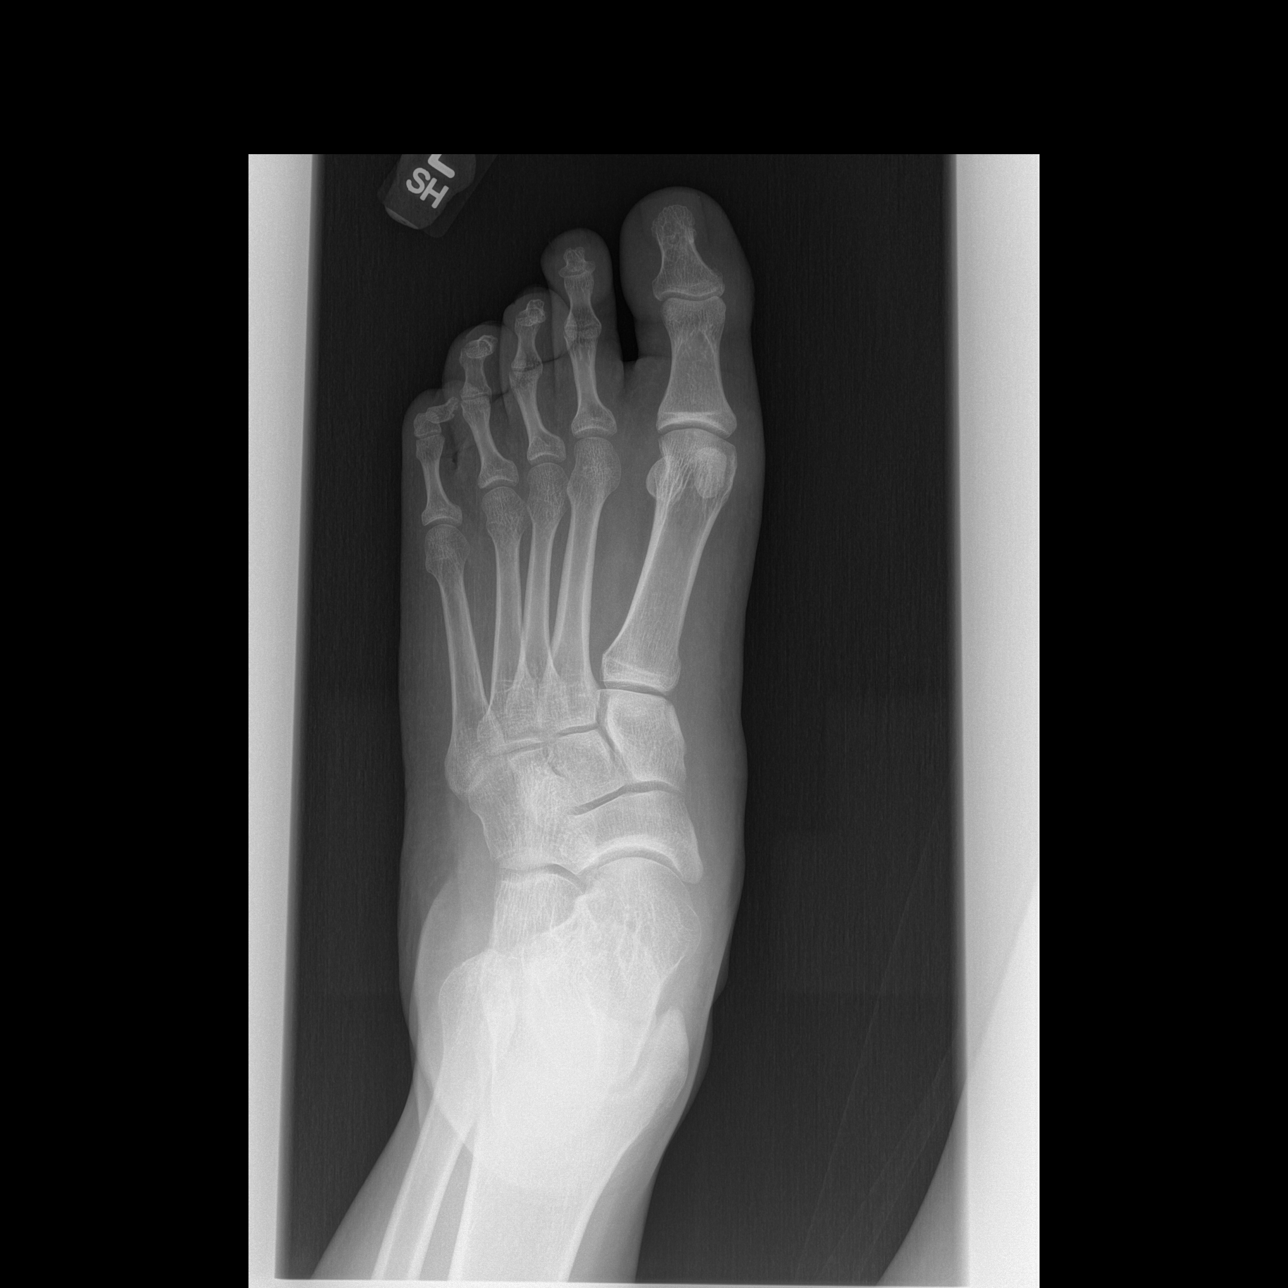

[t foot oblique left]
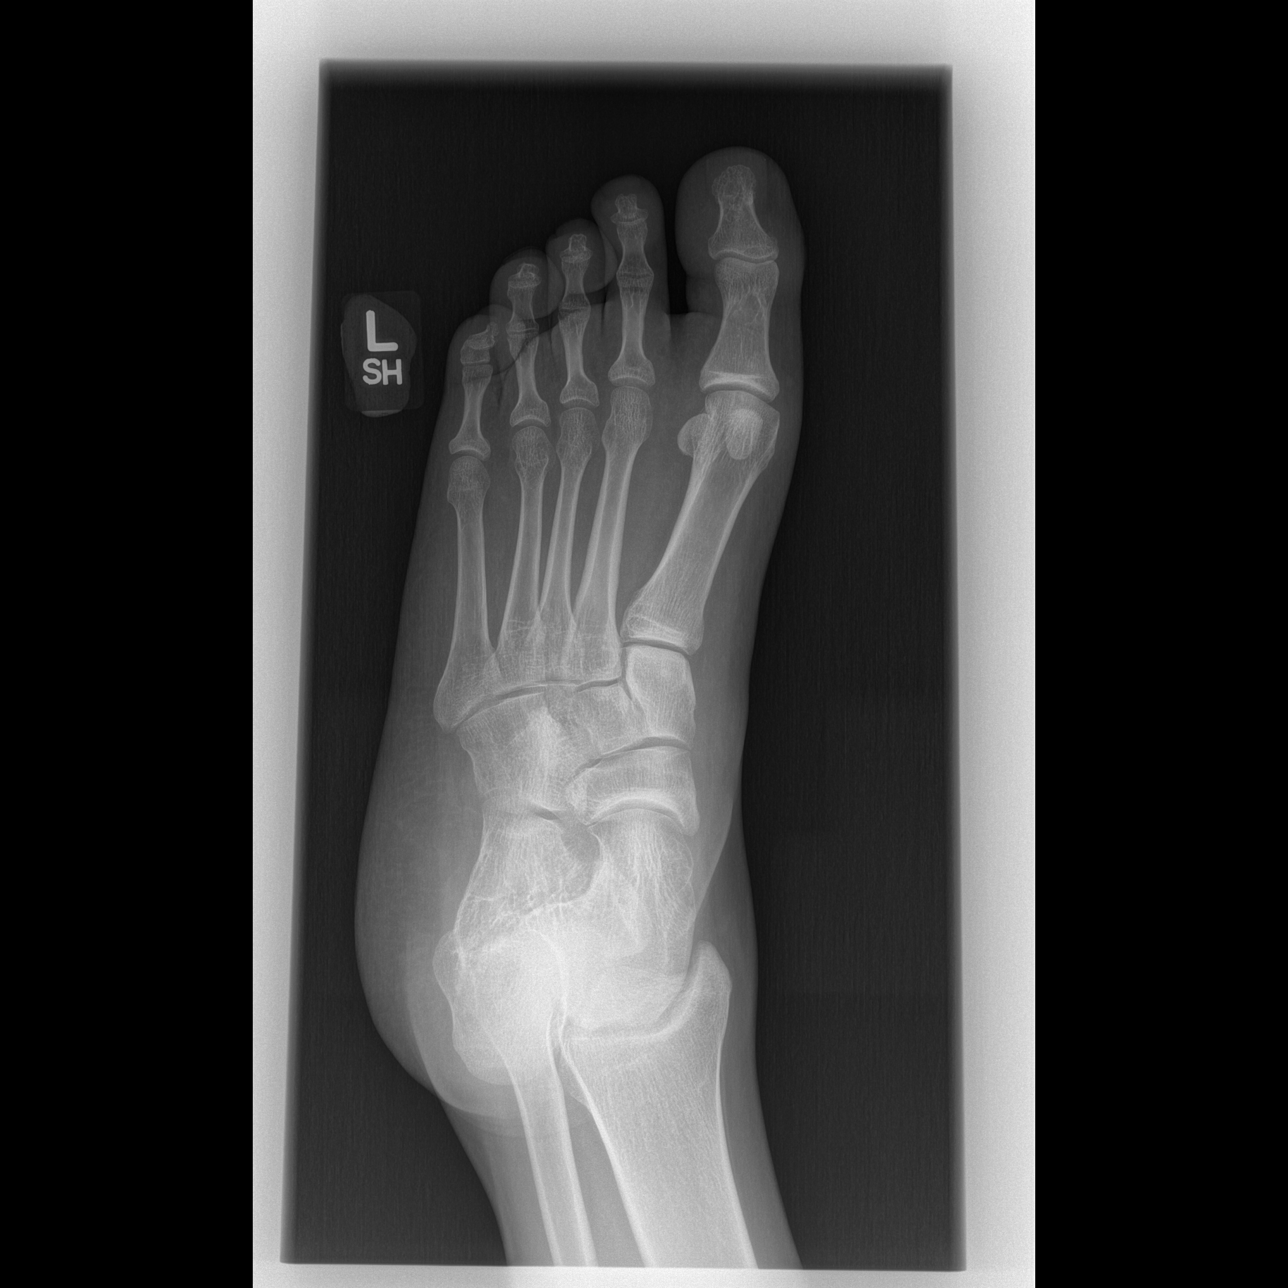

[t foot lat left]
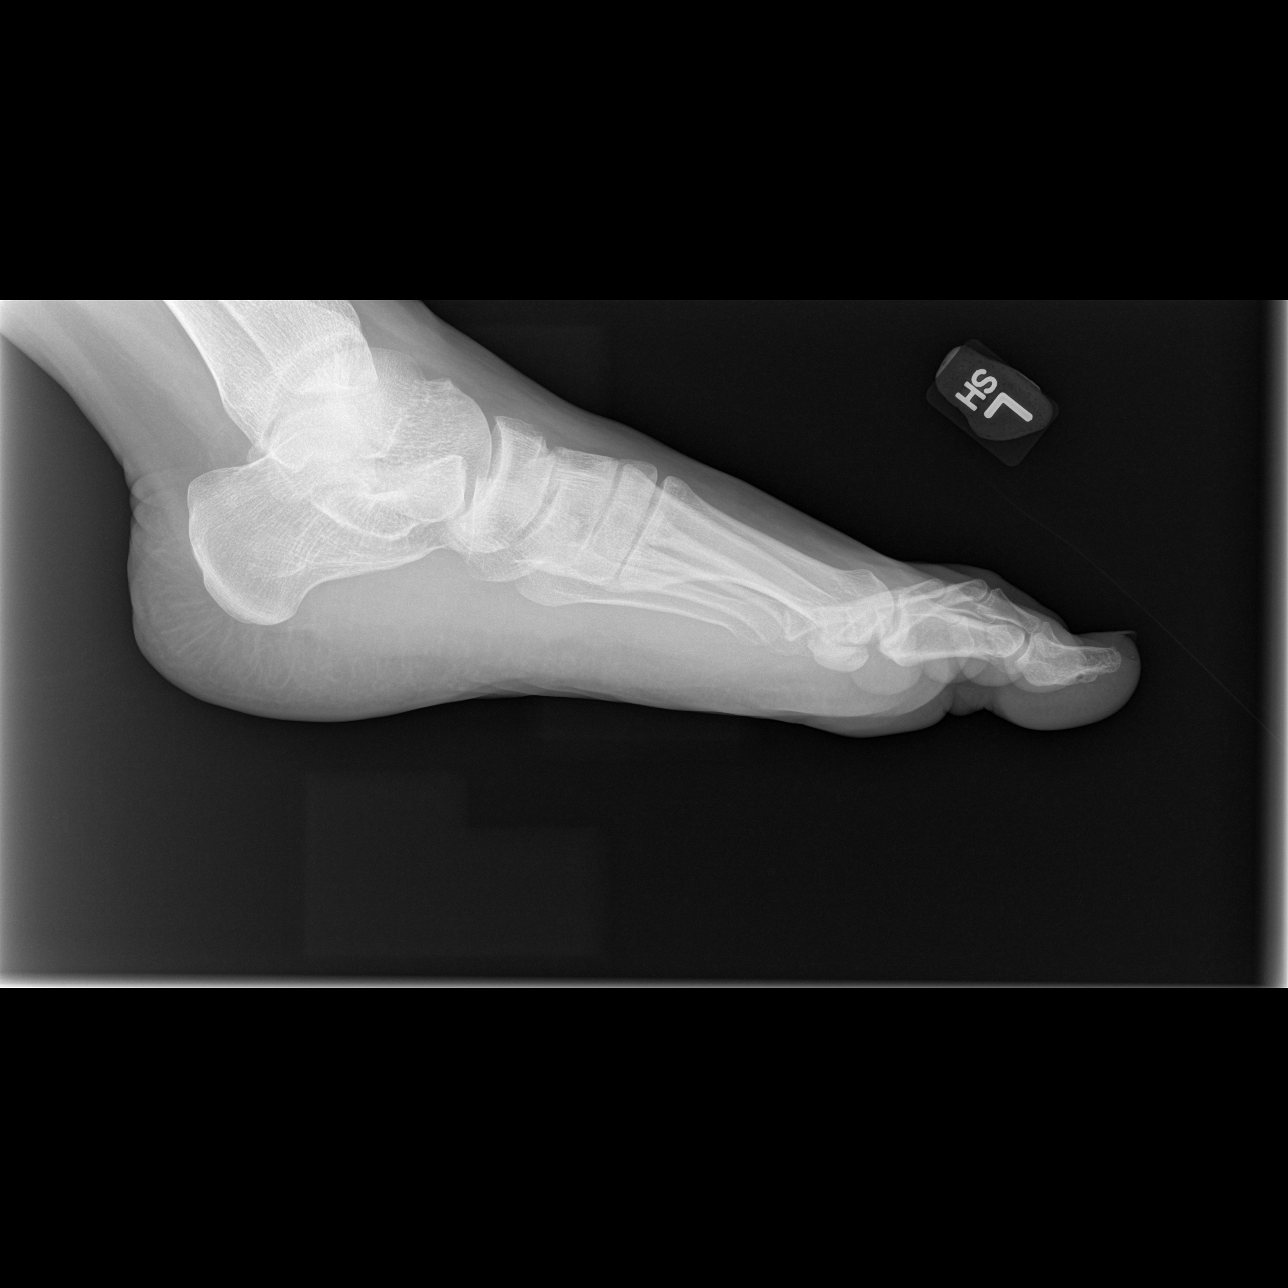

[3 of 3 positions shown; findings below may reference images not displayed]

FINDINGS: There is no evidence of fracture or dislocation. There is no
evidence of arthropathy or other focal bone abnormality. Soft
tissues are unremarkable.
IMPRESSION: Negative.

## 2022-04-15 ENCOUNTER — Encounter (HOSPITAL_BASED_OUTPATIENT_CLINIC_OR_DEPARTMENT_OTHER): Payer: Self-pay

## 2022-04-15 ENCOUNTER — Other Ambulatory Visit: Payer: Self-pay

## 2022-04-15 ENCOUNTER — Emergency Department (HOSPITAL_BASED_OUTPATIENT_CLINIC_OR_DEPARTMENT_OTHER)
Admission: EM | Admit: 2022-04-15 | Discharge: 2022-04-15 | Disposition: A | Discharge status: Home / Self Care | Payer: Self-pay | Attending: Emergency Medicine | Admitting: Emergency Medicine

## 2022-04-15 DIAGNOSIS — W290XXA Contact with powered kitchen appliance, initial encounter: Secondary | ICD-10-CM | POA: Insufficient documentation

## 2022-04-15 DIAGNOSIS — S61411A Laceration without foreign body of right hand, initial encounter: Secondary | ICD-10-CM | POA: Insufficient documentation

## 2022-04-15 NOTE — ED Triage Notes (Signed)
Pt reports pushing trash down with hands today and cut right hand with tuna can ?

## 2022-04-15 NOTE — ED Notes (Signed)
Dc instructions reviewed with pt no questions or concerns at this time.  

## 2022-04-15 NOTE — Discharge Instructions (Addendum)
You were seen in the emergency department today after cutting your hand.  We placed a material called Dermabond which is like glue to your hand.  This will stay over the cut until it falls off on its own like a scab.  Please avoid soaking the hand in a bath or sink.  Please do not scrub the area.  Please return to emergency department for significantly worsening symptoms such as fever, drainage from the area or significant swelling to the hand. ?

## 2022-04-15 NOTE — ED Provider Notes (Signed)
?[facility] [place] EMERGENCY DEPARTMENT ?Provider Note ? ? ?CSN: [id number] ?Arrival date & time: 04/15/22  1848 ? ?  ? ?History ? ?Chief Complaint  ?Patient presents with  ? Laceration  ? ? ?Delvis Kau is a 36 y.o. male.  With no pertinent past medical history presents to the emergency department with hand laceration. ? ?Patient states that he was putting the trash in the trash can when he placed his hand over the trash bag and cut his hand on a tingling form.  Not anticoagulated.  Last tetanus last year. ? ? ?Laceration ? ?  ? ?Home Medications ?Prior to Admission medications   ?Not on File  ?   ? ?Allergies    ?Sulfa antibiotics   ? ?Review of Systems   ?Review of Systems  ?Skin:  Positive for wound.  ?All other systems reviewed and are negative. ? ?Physical Exam ?Updated Vital Signs ?BP (!) 136/100 (BP Location: Left Arm)   Pulse (!) 101   Temp 97.7 ?F (36.5 ?C) (Oral)   Resp 18   SpO2 96%  ?Physical Exam ?Vitals and nursing note reviewed.  ?Constitutional:   ?   General: He is not in acute distress. ?   Appearance: Normal appearance. He is not ill-appearing or toxic-appearing.  ?HENT:  ?   Head: Normocephalic and atraumatic.  ?Eyes:  ?   General: No scleral icterus. ?Pulmonary:  ?   Effort: Pulmonary effort is normal. No respiratory distress.  ?Musculoskeletal:     ?   General: Normal range of motion.  ?Skin: ?   General: Skin is warm and dry.  ?   Capillary Refill: Capillary refill takes less than 2 seconds.  ?   Findings: Laceration present. No rash.  ?   Comments: Small 1.5 cm right palm laceration, superficial and hemostatic  ?Neurological:  ?   General: No focal deficit present.  ?   Mental Status: He is alert and oriented to person, place, and time. Mental status is at baseline.  ?Psychiatric:     ?   Mood and Affect: Mood normal.     ?   Behavior: Behavior normal.     ?   Thought Content: Thought content normal.     ?   Judgment: Judgment normal.  ? ? ?ED Results / Procedures / Treatments    ?Labs ?(all labs ordered are listed, but only abnormal results are displayed) ?Labs Reviewed - No data to display ? ?EKG ?None ? ?Radiology ?No results found. ? ?Procedures ?Marland Kitchen.Laceration Repair ? ?Date/Time: 04/15/2022 8:17 PM ?Performed by: Cristopher Peru, PA-C ?Authorized by: Cristopher Peru, PA-C  ? ?Consent:  ?  Consent obtained:  Verbal ?  Consent given by:  Patient ?  Risks, benefits, and alternatives were discussed: yes   ?  Risks discussed:  Infection, pain, need for additional repair and poor wound healing ?  Alternatives discussed:  No treatment ?Universal protocol:  ?  Procedure explained and questions answered to patient or proxy's satisfaction: yes   ?  Relevant documents present and verified: yes   ?  Test results available: yes   ?  Required blood products, implants, devices, and special equipment available: yes   ?  Site/side marked: yes   ?  Immediately prior to procedure, a time out was called: yes   ?  Patient identity confirmed:  Verbally with patient ?Anesthesia:  ?  Anesthesia method:  None ?Laceration details:  ?  Location:  Hand ?  Hand location:  R palm ?  Length (cm):  1.5 ?  Depth (mm):  1 ?Pre-procedure details:  ?  Preparation:  Patient was prepped and draped in usual sterile fashion ?Exploration:  ?  Limited defect created (wound extended): no   ?  Hemostasis achieved with:  Direct pressure ?  Wound exploration: wound explored through full range of motion and entire depth of wound visualized   ?  Wound extent: areolar tissue violated   ?  Contaminated: no   ?Treatment:  ?  Area cleansed with:  Povidone-iodine and saline ?  Amount of cleaning:  Standard ?  Irrigation solution:  Sterile saline ?  Irrigation volume:  500 ?  Irrigation method:  Pressure wash ?  Visualized foreign bodies/material removed: yes   ?  Debridement:  None ?  Undermining:  None ?  Scar revision: no   ?Skin repair:  ?  Repair method:  Tissue adhesive ?Approximation:  ?  Approximation:  Close ?Repair type:  ?   Repair type:  Simple ?Post-procedure details:  ?  Dressing:  Non-adherent dressing ?  Procedure completion:  Tolerated well, no immediate complications  ? ?Medications Ordered in ED ?Medications - No data to display ? ?ED Course/ Medical Decision Making/ A&P ?  ?                        ?Medical Decision Making ? ?This patient presents to the ED for concern of laceration, this involves an extensive number of treatment options, and is a complaint that carries with it a high risk of complications and morbidity.  The differential diagnosis includes laceration ? ? ?Co morbidities that complicate the patient evaluation ?None ? ?Additional history obtained:  ?Additional history obtained from: None ?External records from outside source obtained and reviewed including: None ? ?EKG: ?Not indicated ? ?Cardiac Monitoring: ?Not indicated ? ?Lab Results: ?Not indicated ? ?Imaging Studies ordered:  ?Not indicated ? ?Medications  ?I ordered medication including dermabond for closure ?Reevaluation of the patient after medication shows that patient resolved ?-I reviewed the patient's home medications and did not make adjustments. ?-I did not prescribe new home medications. ? ?Tests Considered: ?None indicated ? ?Critical Interventions: ?Closure of wound ? ?Consultations: ?Not indicated ? ?SDH ?None identified  ? ?ED Course: ? ?36 year old male who presents to the emergency department with laceration to the right palm.  Hemostatic.  It is very superficial however does require Dermabond for official closure.  No indication of infection.  Last tetanus last year so will not update at this time.  Does not appear dirty. ?Was cleaned with Povidine iodine and sterile saline.  Placed Dermabond with good closure. ?Given follow-up precautions if it reopens or appears infected.  Educated on Dermabond care.  He verbalized understanding. ? ?After consideration of the diagnostic results and the patients response to treatment, I feel that the patent  would benefit from discharge. ?The patient has been appropriately medically screened and/or stabilized in the ED. I have low suspicion for any other emergent medical condition which would require further screening, evaluation or treatment in the ED or require inpatient management. The patient is overall well appearing and non-toxic in appearance. They are hemodynamically stable at time of discharge.   ?Final Clinical Impression(s) / ED Diagnoses ?Final diagnoses:  ?Laceration of right hand without foreign body, initial encounter  ? ? ?Rx / DC Orders ?ED Discharge Orders   ? ? None  ? ?  ? ? ?  ?Mertha BaarsAutry, Shaely Gadberry  E, PA-C ?04/15/22 2209 ? ?  ?Tegeler, Canary Brim, MD ?04/15/22 2317 ? ?

## 2023-02-09 ENCOUNTER — Encounter: Payer: Self-pay | Admitting: Family Medicine

## 2023-05-14 ENCOUNTER — Ambulatory Visit (INDEPENDENT_AMBULATORY_CARE_PROVIDER_SITE_OTHER): Payer: Self-pay | Admitting: Family Medicine

## 2023-05-14 ENCOUNTER — Encounter: Payer: Self-pay | Admitting: Family Medicine

## 2023-05-14 VITALS — BP 120/80 | HR 61 | Temp 98.3°F | Ht 74.0 in | Wt 269.1 lb

## 2023-05-14 DIAGNOSIS — Z9189 Other specified personal risk factors, not elsewhere classified: Secondary | ICD-10-CM

## 2023-05-14 LAB — URINALYSIS
Bilirubin Urine: NEGATIVE
Hgb urine dipstick: NEGATIVE
Ketones, ur: NEGATIVE
Leukocytes,Ua: NEGATIVE
Nitrite: NEGATIVE
Specific Gravity, Urine: 1.025 (ref 1.000–1.030)
Total Protein, Urine: NEGATIVE
Urine Glucose: NEGATIVE
Urobilinogen, UA: 0.2 (ref 0.0–1.0)
pH: 6 (ref 5.0–8.0)

## 2023-05-14 LAB — CBC WITH DIFFERENTIAL/PLATELET
Basophils Absolute: 0.1 10*3/uL (ref 0.0–0.1)
Basophils Relative: 1 % (ref 0.0–3.0)
Eosinophils Absolute: 0.3 10*3/uL (ref 0.0–0.7)
Eosinophils Relative: 4.5 % (ref 0.0–5.0)
HCT: 45.2 % (ref 39.0–52.0)
Hemoglobin: 15.2 g/dL (ref 13.0–17.0)
Lymphocytes Relative: 31.7 % (ref 12.0–46.0)
Lymphs Abs: 2.2 10*3/uL (ref 0.7–4.0)
MCHC: 33.7 g/dL (ref 30.0–36.0)
MCV: 94.4 fl (ref 78.0–100.0)
Monocytes Absolute: 0.6 10*3/uL (ref 0.1–1.0)
Monocytes Relative: 8.1 % (ref 3.0–12.0)
Neutro Abs: 3.9 10*3/uL (ref 1.4–7.7)
Neutrophils Relative %: 54.7 % (ref 43.0–77.0)
Platelets: 315 10*3/uL (ref 150.0–400.0)
RBC: 4.79 Mil/uL (ref 4.22–5.81)
RDW: 13.5 % (ref 11.5–15.5)
WBC: 7 10*3/uL (ref 4.0–10.5)

## 2023-05-14 LAB — COMPREHENSIVE METABOLIC PANEL
ALT: 27 U/L (ref 0–53)
AST: 14 U/L (ref 0–37)
Albumin: 4.3 g/dL (ref 3.5–5.2)
Alkaline Phosphatase: 50 U/L (ref 39–117)
BUN: 12 mg/dL (ref 6–23)
CO2: 27 mEq/L (ref 19–32)
Calcium: 9.9 mg/dL (ref 8.4–10.5)
Chloride: 103 mEq/L (ref 96–112)
Creatinine, Ser: 1.02 mg/dL (ref 0.40–1.50)
GFR: 95.94 mL/min (ref >60.00)
Glucose, Bld: 84 mg/dL (ref 70–99)
Potassium: 4.1 mEq/L (ref 3.5–5.1)
Sodium: 137 mEq/L (ref 135–145)
Total Bilirubin: 0.8 mg/dL (ref 0.2–1.2)
Total Protein: 6.9 g/dL (ref 6.0–8.3)

## 2023-05-14 NOTE — Progress Notes (Signed)
Chief Complaint  Patient presents with   Annual Exam    Subjective: Patient is a 37 y.o. male here for PrEP discussion.  Patient is in a monogamous homosexual relationship.  His partner does not have HIV nor does he.  He is interested in starting preexposure prophylaxis for HIV.  He has never been on medication before.  He has no known history of hepatitis C or hepatitis B.  Past Medical History:  Diagnosis Date   Allergy    Environmental allergies     Objective: BP 120/80 (BP Location: Right Arm, Patient Position: Sitting, Cuff Size: Large)   Pulse 61   Temp 98.3 F (36.8 C) (Oral)   Ht 6\' 2"  (1.88 m)   Wt 269 lb 2 oz (122.1 kg)   SpO2 98%   BMI 34.55 kg/m  General: Awake, appears stated age Heart: RRR, no LE edema Lungs: CTAB, no rales, wheezes or rhonchi. No accessory muscle use Psych: Age appropriate judgment and insight, normal affect and mood  Assessment and Plan: At high risk for exposure to HIV - Plan: CBC, Comprehensive metabolic panel, CBC w/Diff, Urinalysis, HIV Antibody (routine testing w rflx), Hepatitis B surface antibody,quantitative, Hepatitis B Core Antibody, total, Hepatitis B Surface AntiGEN  Check the above labs.  If normal, will start Truvada.  Recommended using [facility] to help with cost as he does not currently have insurance.  Practice safe sex.  Follow-up in 6 months to recheck. The patient voiced understanding and agreement to the plan.  Jilda Roche [place], DO 05/14/23  7:52 AM

## 2023-05-14 NOTE — Patient Instructions (Addendum)
Give [place] 2-3 business days to get the results of your labs back.   Keep the diet clean and stay active.  Use GoodRx when you fill this prescription.  Practice safe sex.   Let us know if you need anything.

## 2023-05-16 ENCOUNTER — Encounter: Payer: Self-pay | Admitting: Family Medicine

## 2023-05-16 LAB — HEPATITIS B SURFACE ANTIBODY, QUANTITATIVE: Hep B S AB Quant (Post): 103 m[IU]/mL (ref >=10)

## 2023-05-16 LAB — HEPATITIS B CORE ANTIBODY, TOTAL: Hep B Core Total Ab: NONREACTIVE

## 2023-05-16 LAB — HIV ANTIBODY (ROUTINE TESTING W REFLEX): HIV 1&2 Ab, 4th Generation: NONREACTIVE

## 2023-05-16 LAB — HEPATITIS B SURFACE ANTIGEN: Hepatitis B Surface Ag: NONREACTIVE

## 2023-05-18 ENCOUNTER — Other Ambulatory Visit: Payer: Self-pay | Admitting: Family Medicine

## 2023-05-18 MED ORDER — EMTRICITABINE-TENOFOVIR DF 200-300 MG PO TABS: 1.0000 | ORAL_TABLET | Freq: Every day | ORAL | 12 refills | Status: DC

## 2023-05-19 ENCOUNTER — Encounter: Payer: Self-pay | Admitting: Family Medicine

## 2023-06-01 ENCOUNTER — Encounter: Payer: Self-pay | Admitting: Family Medicine

## 2023-08-10 ENCOUNTER — Telehealth: Payer: Self-pay | Admitting: Family Medicine

## 2023-08-10 ENCOUNTER — Encounter: Payer: Self-pay | Admitting: Family Medicine

## 2023-08-10 NOTE — Telephone Encounter (Signed)
What medication is this?

## 2023-08-10 NOTE — Telephone Encounter (Signed)
Pt called & stated [facility] doesn't cover the medication emtricitabine-tenofovir (TRUVADA) 200-300 MG tablet anymore & that it would cost him $1,000 out-of-pocket. He wants to know if there is another brand or medication he can be prescribed. Please call & advise pt.

## 2023-08-12 ENCOUNTER — Encounter: Payer: Self-pay | Admitting: Family Medicine

## 2023-08-12 ENCOUNTER — Other Ambulatory Visit: Payer: Self-pay | Admitting: Family Medicine

## 2023-08-12 MED ORDER — EMTRICITABINE-TENOFOVIR DF 200-300 MG PO TABS: 1.0000 | ORAL_TABLET | Freq: Every day | ORAL | 11 refills | Status: DC

## 2023-08-12 MED ORDER — EMTRICITABINE-TENOFOVIR AF 200-25 MG PO TABS: 1.0000 | ORAL_TABLET | Freq: Every day | ORAL | 5 refills | Status: DC

## 2023-08-12 NOTE — Telephone Encounter (Signed)
Pt called back & stated the medication emtricitabine-tenofovir (TRUVADA) 200-300 MG, is covered with [facility] at [facility]. Pt originally asked for the change because it wasn't covered at [facility] but it is at [employer] [facility]. If possible to re send, can the medication be sent to   [facility] [id number] - [place], [place] [address], [place] [address]

## 2023-08-12 NOTE — Telephone Encounter (Signed)
Medication sent in today was too expensive. Please send in generic Truvada at [facility] [address] [facility] Send a message on mychart when sent in

## 2023-08-12 NOTE — Telephone Encounter (Signed)
I thought he specifically asked for the [place]cov[place]? Truvada sent.

## 2023-08-12 NOTE — Addendum Note (Signed)
Addended by: Radene Gunning on: 08/12/2023 03:32 PM   Modules accepted: Orders

## 2023-08-12 NOTE — Telephone Encounter (Signed)
Pt states he is not due for an appt until November so he does not understand why he needs an appt because he just wants another brand sent to good rx so they can help cover the cost. He states he is working a lot and it is very hard for him to get in before he runs out of rx. Pcp is also fully booked. Please advise if he can do this virtually or if this can be discussed through mychart.

## 2023-11-13 ENCOUNTER — Encounter: Payer: Self-pay | Admitting: Family Medicine

## 2023-11-17 ENCOUNTER — Encounter: Payer: Self-pay | Admitting: Family Medicine

## 2023-11-17 ENCOUNTER — Other Ambulatory Visit: Payer: Self-pay

## 2023-11-17 ENCOUNTER — Ambulatory Visit (INDEPENDENT_AMBULATORY_CARE_PROVIDER_SITE_OTHER): Payer: Self-pay | Admitting: Family Medicine

## 2023-11-17 VITALS — BP 132/82 | HR 73 | Temp 98.0°F | Resp 16 | Ht 74.0 in | Wt 263.2 lb

## 2023-11-17 DIAGNOSIS — Z9189 Other specified personal risk factors, not elsewhere classified: Secondary | ICD-10-CM

## 2023-11-17 DIAGNOSIS — Z23 Encounter for immunization: Secondary | ICD-10-CM

## 2023-11-17 LAB — COMPREHENSIVE METABOLIC PANEL
ALT: 29 U/L (ref 0–53)
AST: 16 U/L (ref 0–37)
Albumin: 4.4 g/dL (ref 3.5–5.2)
Alkaline Phosphatase: 53 U/L (ref 39–117)
BUN: 18 mg/dL (ref 6–23)
CO2: 29 meq/L (ref 19–32)
Calcium: 10 mg/dL (ref 8.4–10.5)
Chloride: 103 meq/L (ref 96–112)
Creatinine, Ser: 1.01 mg/dL (ref 0.40–1.50)
GFR: 96.73 mL/min (ref >60.00)
Glucose, Bld: 76 mg/dL (ref 70–99)
Potassium: 3.8 meq/L (ref 3.5–5.1)
Sodium: 138 meq/L (ref 135–145)
Total Bilirubin: 0.8 mg/dL (ref 0.2–1.2)
Total Protein: 6.9 g/dL (ref 6.0–8.3)

## 2023-11-17 LAB — CBC
HCT: 47.4 % (ref 39.0–52.0)
Hemoglobin: 15.7 g/dL (ref 13.0–17.0)
MCHC: 33.1 g/dL (ref 30.0–36.0)
MCV: 102.1 fL — ABNORMAL HIGH (ref 78.0–100.0)
Platelets: 296 10*3/uL (ref 150.0–400.0)
RBC: 4.64 Mil/uL (ref 4.22–5.81)
RDW: 13 % (ref 11.5–15.5)
WBC: 6.5 10*3/uL (ref 4.0–10.5)

## 2023-11-17 LAB — URINALYSIS, MICROSCOPIC ONLY: RBC / HPF: NONE SEEN (ref 0–?)

## 2023-11-17 NOTE — Addendum Note (Signed)
Addended by: Kathi Ludwig on: 11/17/2023 08:39 AM   Modules accepted: Orders

## 2023-11-17 NOTE — Progress Notes (Signed)
Chief Complaint  Patient presents with   Follow-up    Follow up    Subjective: Patient is a 37 y.o. male here for HIV prophylaxis.  Pt is male who has sex with men. He is taking Truvada for HIV prophylaxis. No current partners. Was in monogamous homosexual relationship.   Past Medical History:  Diagnosis Date   Allergy    Environmental allergies     Objective: BP 132/82 (BP Location: Left Arm, Patient Position: Sitting, Cuff Size: Normal)   Pulse 73   Temp 98 F (36.7 C) (Oral)   Resp 16   Ht 6\' 2"  (1.88 m)   Wt 263 lb 3.2 oz (119.4 kg)   SpO2 97%   BMI 33.79 kg/m  General: Awake, appears stated age Heart: RRR, no LE edema Lungs: CTAB, no rales, wheezes or rhonchi. No accessory muscle use Psych: Age appropriate judgment and insight, normal affect and mood  Assessment and Plan: At high risk for exposure to HIV - Plan: CBC, Comprehensive metabolic panel, HIV Antibody (routine testing w rflx), Hepatitis C antibody, Urine Microscopic Only, Urine cytology ancillary only([place])  Chronic, stable.  Continue Truvada 200-300 mg daily.  Practice safe sex.  Check above labs for maintenance.  He will get his first HPV vaccination today.  Next 1 will be in about 1 to 2 months and then a final and will be at his 52-month follow-up for his physical as he is getting insurance next month. The patient voiced understanding and agreement to the plan.  Jilda Roche [place], DO 11/17/23  7:56 AM

## 2023-11-17 NOTE — Patient Instructions (Addendum)
Give [place] 2-3 business days to get the results of your labs back.  Let us know if you need anything.

## 2023-11-18 ENCOUNTER — Other Ambulatory Visit: Payer: Self-pay

## 2023-11-18 ENCOUNTER — Encounter: Payer: Self-pay | Admitting: Family Medicine

## 2023-11-18 ENCOUNTER — Other Ambulatory Visit (INDEPENDENT_AMBULATORY_CARE_PROVIDER_SITE_OTHER): Payer: Self-pay

## 2023-11-18 DIAGNOSIS — Z9189 Other specified personal risk factors, not elsewhere classified: Secondary | ICD-10-CM

## 2023-11-18 LAB — HEPATITIS C ANTIBODY: Hepatitis C Ab: NONREACTIVE

## 2023-11-18 LAB — B12 AND FOLATE PANEL
Folate: 4.4 ng/mL — ABNORMAL LOW (ref >5.9)
Vitamin B-12: 672 pg/mL (ref 211–911)

## 2023-11-18 LAB — HIV ANTIBODY (ROUTINE TESTING W REFLEX): HIV 1&2 Ab, 4th Generation: NONREACTIVE

## 2023-11-18 NOTE — Telephone Encounter (Signed)
Lab appt scheduled and pt aware.

## 2023-11-23 ENCOUNTER — Encounter: Payer: Self-pay | Admitting: Family Medicine

## 2023-12-08 ENCOUNTER — Encounter: Payer: Self-pay | Admitting: Family Medicine

## 2023-12-22 ENCOUNTER — Encounter: Payer: Self-pay | Admitting: Family Medicine

## 2023-12-28 NOTE — Progress Notes (Addendum)
 Hiroki Wint is a 38 y.o. male presents to the office today for Hep A and 2nd HPV injections, per physician's orders. Original order: 11/17/23- HPV, 12/24/23- Hep A HPV 0.5 mL, IM was administered L Deltoid today. Patient tolerated injection. Hep A  1 mL,  IM was administered L Deltoid today. Patient tolerated injection. Patient due for follow up labs/provider appt: No. Date due: May for CPE, appt made: PENDING Patient next injection due: Pt will get next Hep A and HPV doses at CPE. Appt made.   Creft, Tierra L

## 2023-12-29 ENCOUNTER — Other Ambulatory Visit: Payer: Self-pay | Admitting: Family Medicine

## 2023-12-29 ENCOUNTER — Other Ambulatory Visit (INDEPENDENT_AMBULATORY_CARE_PROVIDER_SITE_OTHER): Payer: No Typology Code available for payment source

## 2023-12-29 ENCOUNTER — Ambulatory Visit (INDEPENDENT_AMBULATORY_CARE_PROVIDER_SITE_OTHER): Payer: No Typology Code available for payment source

## 2023-12-29 ENCOUNTER — Encounter: Payer: Self-pay | Admitting: Family Medicine

## 2023-12-29 DIAGNOSIS — Z9189 Other specified personal risk factors, not elsewhere classified: Secondary | ICD-10-CM | POA: Diagnosis not present

## 2023-12-29 DIAGNOSIS — Z23 Encounter for immunization: Secondary | ICD-10-CM

## 2023-12-29 LAB — B12 AND FOLATE PANEL
Folate: 5 ng/mL — ABNORMAL LOW (ref >5.9)
Vitamin B-12: 1004 pg/mL — ABNORMAL HIGH (ref 211–911)

## 2023-12-29 MED ORDER — EMTRICITABINE-TENOFOVIR DF 200-300 MG PO TABS: 1.0000 | ORAL_TABLET | Freq: Every day | ORAL | 3 refills | Status: DC

## 2023-12-30 ENCOUNTER — Other Ambulatory Visit: Payer: Self-pay

## 2023-12-30 ENCOUNTER — Encounter: Payer: Self-pay | Admitting: Family Medicine

## 2023-12-30 ENCOUNTER — Telehealth: Payer: Self-pay

## 2023-12-30 DIAGNOSIS — E538 Deficiency of other specified B group vitamins: Secondary | ICD-10-CM

## 2023-12-30 MED ORDER — EMTRICITABINE-TENOFOVIR DF 200-300 MG PO TABS: 1.0000 | ORAL_TABLET | Freq: Every day | ORAL | 3 refills | Status: DC

## 2023-12-30 NOTE — Telephone Encounter (Signed)
 Pt was advise

## 2023-12-30 NOTE — Telephone Encounter (Signed)
 Called pt was advised of the PA for medication.

## 2023-12-30 NOTE — Telephone Encounter (Signed)
 Called pt was advised Vitamin  over the counter. And other medication has been sent Karin Golden

## 2024-01-01 ENCOUNTER — Encounter: Payer: Self-pay | Admitting: Family Medicine

## 2024-01-04 ENCOUNTER — Encounter: Payer: Self-pay | Admitting: Family Medicine

## 2024-01-06 ENCOUNTER — Encounter: Payer: Self-pay | Admitting: Family Medicine

## 2024-01-08 ENCOUNTER — Encounter: Payer: Self-pay | Admitting: Family Medicine

## 2024-01-08 ENCOUNTER — Other Ambulatory Visit: Payer: Self-pay

## 2024-01-08 ENCOUNTER — Telehealth: Payer: Self-pay

## 2024-01-08 DIAGNOSIS — Z9189 Other specified personal risk factors, not elsewhere classified: Secondary | ICD-10-CM

## 2024-01-08 NOTE — Telephone Encounter (Signed)
Copied from CRM [id number]. Topic: General - Other >> Jan 08, 2024  2:31 PM [place] A wrote: Reason for CRM: Patient wanted to make sure that the HIV,CBC and Folate test were added to his labs in March. Patient also wants to know how long before he can received the results after labs? Patient said he can be reach via MyChart and by phone

## 2024-01-08 NOTE — Telephone Encounter (Signed)
Copied from CRM [id number]. Topic: General - Other >> Jan 08, 2024  9:07 AM Fredrich Romans wrote: Reason for CRM: patient sent message via mychart asking for labwork for CBC and HIV to be placed.He would like to know if he could have these orders place for him to get drawn along with his b12 labs in March

## 2024-01-08 NOTE — Telephone Encounter (Signed)
Labs ordered and message sent to pt. °

## 2024-01-15 ENCOUNTER — Telehealth: Payer: Self-pay | Admitting: Family Medicine

## 2024-01-15 ENCOUNTER — Encounter: Payer: Self-pay | Admitting: Family Medicine

## 2024-01-15 NOTE — Telephone Encounter (Signed)
Spoke with pt about time.

## 2024-01-15 NOTE — Telephone Encounter (Signed)
Patient is needing his hep a vaccine and hpv vaccine as well on march the 4th when he comes in for his labs he would like to go ahead and get that done as well I was unable to schedule a nurse visit for patient it was trying to schedule patient at the [place] office

## 2024-01-19 ENCOUNTER — Encounter: Payer: Self-pay | Admitting: Family Medicine

## 2024-02-23 ENCOUNTER — Encounter: Payer: Self-pay | Admitting: Family Medicine

## 2024-02-23 ENCOUNTER — Ambulatory Visit: Payer: No Typology Code available for payment source

## 2024-02-23 ENCOUNTER — Other Ambulatory Visit: Payer: No Typology Code available for payment source

## 2024-02-23 ENCOUNTER — Other Ambulatory Visit (INDEPENDENT_AMBULATORY_CARE_PROVIDER_SITE_OTHER): Payer: No Typology Code available for payment source

## 2024-02-23 DIAGNOSIS — E538 Deficiency of other specified B group vitamins: Secondary | ICD-10-CM

## 2024-02-23 DIAGNOSIS — Z9189 Other specified personal risk factors, not elsewhere classified: Secondary | ICD-10-CM

## 2024-02-23 LAB — CBC WITH DIFFERENTIAL/PLATELET
Basophils Absolute: 0.1 10*3/uL (ref 0.0–0.1)
Basophils Relative: 0.9 % (ref 0.0–3.0)
Eosinophils Absolute: 0.4 10*3/uL (ref 0.0–0.7)
Eosinophils Relative: 5 % (ref 0.0–5.0)
HCT: 45.3 % (ref 39.0–52.0)
Hemoglobin: 15.5 g/dL (ref 13.0–17.0)
Lymphocytes Relative: 37.7 % (ref 12.0–46.0)
Lymphs Abs: 2.8 10*3/uL (ref 0.7–4.0)
MCHC: 34.3 g/dL (ref 30.0–36.0)
MCV: 100 fl (ref 78.0–100.0)
Monocytes Absolute: 0.7 10*3/uL (ref 0.1–1.0)
Monocytes Relative: 8.9 % (ref 3.0–12.0)
Neutro Abs: 3.5 10*3/uL (ref 1.4–7.7)
Neutrophils Relative %: 47.5 % (ref 43.0–77.0)
Platelets: 320 10*3/uL (ref 150.0–400.0)
RBC: 4.53 Mil/uL (ref 4.22–5.81)
RDW: 13 % (ref 11.5–15.5)
WBC: 7.4 10*3/uL (ref 4.0–10.5)

## 2024-02-23 LAB — B12 AND FOLATE PANEL
Folate: 9.7 ng/mL (ref >5.9)
Vitamin B-12: 711 pg/mL (ref 211–911)

## 2024-02-24 ENCOUNTER — Encounter: Payer: Self-pay | Admitting: Family Medicine

## 2024-02-24 LAB — HIV ANTIBODY (ROUTINE TESTING W REFLEX): HIV 1&2 Ab, 4th Generation: NONREACTIVE

## 2024-05-09 ENCOUNTER — Encounter: Payer: Self-pay | Admitting: Family Medicine

## 2024-05-20 ENCOUNTER — Other Ambulatory Visit: Payer: Self-pay

## 2024-05-20 ENCOUNTER — Encounter: Payer: Self-pay | Admitting: Family Medicine

## 2024-05-20 ENCOUNTER — Ambulatory Visit (INDEPENDENT_AMBULATORY_CARE_PROVIDER_SITE_OTHER): Payer: Self-pay | Admitting: Family Medicine

## 2024-05-20 ENCOUNTER — Other Ambulatory Visit: Payer: Self-pay | Admitting: Family Medicine

## 2024-05-20 ENCOUNTER — Ambulatory Visit: Payer: Self-pay | Admitting: Family Medicine

## 2024-05-20 VITALS — BP 128/78 | HR 73 | Temp 98.0°F | Resp 16 | Ht 74.0 in | Wt 256.0 lb

## 2024-05-20 DIAGNOSIS — M549 Dorsalgia, unspecified: Secondary | ICD-10-CM | POA: Diagnosis not present

## 2024-05-20 DIAGNOSIS — Z0000 Encounter for general adult medical examination without abnormal findings: Secondary | ICD-10-CM

## 2024-05-20 DIAGNOSIS — H6123 Impacted cerumen, bilateral: Secondary | ICD-10-CM | POA: Diagnosis not present

## 2024-05-20 DIAGNOSIS — J302 Other seasonal allergic rhinitis: Secondary | ICD-10-CM

## 2024-05-20 DIAGNOSIS — Z23 Encounter for immunization: Secondary | ICD-10-CM | POA: Diagnosis not present

## 2024-05-20 DIAGNOSIS — E7800 Pure hypercholesterolemia, unspecified: Secondary | ICD-10-CM

## 2024-05-20 LAB — LIPID PANEL
Cholesterol: 194 mg/dL (ref 0–200)
HDL: 38.8 mg/dL — ABNORMAL LOW (ref >39.00)
LDL Cholesterol: 139 mg/dL — ABNORMAL HIGH (ref 0–99)
NonHDL: 155.22
Total CHOL/HDL Ratio: 5
Triglycerides: 83 mg/dL (ref 0.0–149.0)
VLDL: 16.6 mg/dL (ref 0.0–40.0)

## 2024-05-20 LAB — COMPREHENSIVE METABOLIC PANEL WITH GFR
ALT: 27 U/L (ref 0–53)
AST: 17 U/L (ref 0–37)
Albumin: 4.7 g/dL (ref 3.5–5.2)
Alkaline Phosphatase: 52 U/L (ref 39–117)
BUN: 22 mg/dL (ref 6–23)
CO2: 31 meq/L (ref 19–32)
Calcium: 10 mg/dL (ref 8.4–10.5)
Chloride: 102 meq/L (ref 96–112)
Creatinine, Ser: 1.12 mg/dL (ref 0.40–1.50)
GFR: 85.14 mL/min (ref >60.00)
Glucose, Bld: 83 mg/dL (ref 70–99)
Potassium: 4 meq/L (ref 3.5–5.1)
Sodium: 139 meq/L (ref 135–145)
Total Bilirubin: 0.9 mg/dL (ref 0.2–1.2)
Total Protein: 7.2 g/dL (ref 6.0–8.3)

## 2024-05-20 MED ORDER — LEVOCETIRIZINE DIHYDROCHLORIDE 5 MG PO TABS
5.0000 mg | ORAL_TABLET | Freq: Every evening | ORAL | 2 refills | Status: DC
Start: 2024-05-20 — End: 2024-08-09

## 2024-05-20 MED ORDER — METHYLPREDNISOLONE ACETATE 80 MG/ML IJ SUSP
80.0000 mg | Freq: Once | INTRAMUSCULAR | Status: AC
  Administered 2024-05-20: 80 mg via INTRAMUSCULAR

## 2024-05-20 MED ORDER — ROSUVASTATIN CALCIUM 5 MG PO TABS: 5.0000 mg | ORAL_TABLET | Freq: Every day | ORAL | 3 refills | Status: AC

## 2024-05-20 MED ORDER — TIZANIDINE HCL 4 MG PO TABS: 4.0000 mg | ORAL_TABLET | Freq: Four times a day (QID) | ORAL | 0 refills | Status: DC | PRN

## 2024-05-20 NOTE — Telephone Encounter (Signed)
 Labs ordered and sent pt message to call setup lab appt.

## 2024-05-20 NOTE — Addendum Note (Signed)
 Addended by: Kristene Liberati M on: 05/20/2024 07:54 AM   Modules accepted: Orders

## 2024-05-20 NOTE — Progress Notes (Signed)
 Chief Complaint  Patient presents with   Annual Exam    CPE    Well Male Phillip Coleman is here for a complete physical.   His last physical was >1 year ago.  Current diet: in general, a "healthy" diet.   Current exercise: walking/active at work Weight trend: down a few lbs Fatigue out of ordinary? No. Seat belt? Yes.   Advanced directive? No- has form from last year  Health maintenance Tetanus- Yes HIV- Yes Hep C- Yes  L back pain Hx of mid back pain off and on over the past few mo. 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  No recent injury but his physical requirements at work seem to have contributed to his situation.  Denies any numbness, tingling, weakness, bowel/bladder issues.   Seasonal allergies good patient has been taking Flonase with little relief of his congestion and runny nose.  He is wondering if there is anything else.  He has tried various over-the-counter medications without significant relief.  Past Medical History:  Diagnosis Date   Allergy    Environmental allergies      Past Surgical History:  Procedure Laterality Date   NO PAST SURGERIES      Medications  Current Outpatient Medications on File Prior to Visit  Medication Sig Dispense Refill   emtricitabine -tenofovir  (TRUVADA) 200-300 MG tablet Take 1 tablet by mouth daily. 90 tablet 3    Allergies Allergies  Allergen Reactions   Sulfa Antibiotics Hives    Family History Family History  Problem Relation Age of Onset   Hypertension Mother    Cancer Father        non hodgins lymphoma   Diabetes Maternal Aunt    Cancer Maternal Uncle        pancreatic cancer   Cancer Maternal Grandmother        pancreatic cancer    Review of Systems: Constitutional: no fevers or chills Eye:  no recent significant change in vision Ear/Nose/Mouth/Throat:  Ears:  no hearing loss Nose/Mouth/Throat:  no complaints of nasal congestion, no sore throat Cardiovascular:  no chest pain Respiratory:  no shortness of  breath Gastrointestinal:  no abdominal pain, no change in bowel habits GU:  Male: negative for dysuria Musculoskeletal/Extremities:  no pain of the joints Integumentary (Skin/Breast):  no abnormal skin lesions reported Neurologic:  no headaches Endocrine: No unexpected weight changes Hematologic/Lymphatic:  no night sweats  Exam BP 128/78 (BP Location: Left Arm, Patient Position: Sitting)   Pulse 73   Temp 98 F (36.7 C) (Oral)   Resp 16   Ht 6\' 2"  (1.88 m)   Wt 256 lb (116.1 kg)   SpO2 98%   BMI 32.87 kg/m  General:  well developed, well nourished, in no apparent distress Skin:  no significant moles, warts, or growths Head:  no masses, lesions, or tenderness Eyes:  pupils equal and round, sclera anicteric without injection Ears:  canals 100% obstructed with cerumen bilaterally Nose:  nares patent, mucosa normal Throat/Pharynx:  lips and gingiva without lesion; tongue and uvula midline; non-inflamed pharynx; no exudates or postnasal drainage Neck: neck supple without adenopathy, thyromegaly, or masses Lungs:  clear to auscultation, breath sounds equal bilaterally, no respiratory distress Cardio:  regular rate and rhythm, no bruits, no LE edema Abdomen:  abdomen soft, nontender; bowel sounds normal; no masses or organomegaly Genital (male): Deferred Rectal: Deferred Musculoskeletal: TTP over the left rhomboid and thoracic paraspinal musculature around T5-7; otherwise symmetrical muscle groups noted without atrophy or deformity Extremities:  no  clubbing, cyanosis, or edema, no deformities, no skin discoloration Neuro:  gait normal; deep tendon reflexes normal and symmetric Psych: well oriented with normal range of affect and appropriate judgment/insight  Assessment and Plan  Well adult exam - Plan: Comprehensive metabolic panel with GFR, Lipid panel  Acute mid back pain - Plan: tiZANidine (ZANAFLEX) 4 MG tablet  Seasonal allergies - Plan: levocetirizine (XYZAL) 5 MG  tablet  Bilateral impacted cerumen   Well 38 y.o. male. Counseled on diet and exercise. Self testicular exams recommended at least monthly.  HPV 3/3 today.  Mid back pain: Recurrent issue.  Depo-Medrol  injection today.  Zanaflex as needed.  Warned about drowsiness.  Heat, ice, Tylenol , stretches and exercises.  Send message in 1 month if no better and we will consider physical therapy. Seasonal allergies: Continue Flonase.  Add Xyzal.  Consider allergist if no improvement in 1 month. Home care instructions provided for cerumen buildup.  Do not use Q-tips.  Flush today. Advanced directive form requested today.  Other orders as above. Follow up in 6 months pending the above workup. The patient voiced understanding and agreement to the plan.  Shellie Dials [place], DO 05/20/24 7:36 AM

## 2024-05-20 NOTE — Patient Instructions (Addendum)
 Give us  2-3 business days to get the results of your labs back.   Keep the diet clean and stay active.  Do monthly self testicular checks in the shower. You are feeling for lumps/bumps that don't belong. If you feel anything like this, let me know!  Please get me a copy of your advanced directive form at your convenience.   Heat (pad or rice pillow in microwave) over affected area, 10-15 minutes twice daily.   Take Zanaflex (tizanidine) 1-2 hours before planned bedtime. If it makes you drowsy, do not take during the day. You can try half a tab the following night.  Continue Flonase. Send me a message in 1 mo if allergies no better.   OK to use Debrox (peroxide) in the ear to loosen up wax. Also recommend using a bulb syringe (for removing boogers from baby's noses) to flush through warm water and vinegar (3-4:1 ratio). An alternative, though more expensive, is an elephant ear washer wax removal kit. Do not use Q-tips as this can impact wax further.  Let us  know if you need anything.  Mid-Back Strain Rehab It is normal to feel mild stretching, pulling, tightness, or discomfort as you do these exercises, but you should stop right away if you feel sudden pain or your pain gets worse.  Stretching and range of motion exercises This exercise warms up your muscles and joints and improves the movement and flexibility of your back and shoulders. This exercise also help to relieve pain. Exercise A: Chest and spine stretch  Lie down on your back on a firm surface. Roll a towel or a small blanket so it is about 4 inches (10 cm) in diameter. Put the towel lengthwise under the middle of your back so it is under your spine, but not under your shoulder blades. To increase the stretch, you may put your hands behind your head and let your elbows fall to your sides. Hold for 30 seconds. Repeat exercise 2 times. Complete this exercise 3 times per week. Strengthening exercises These exercises build  strength and endurance in your back and your shoulder blade muscles. Endurance is the ability to use your muscles for a long time, even after they get tired. Exercise C: Straight arm rows (shoulder extension) Stand with your feet shoulder width apart. Secure an exercise band to a stable object in front of you so the band is at or above shoulder height. Hold one end of the exercise band in each hand. Straighten your elbows and lift your hands up to shoulder height. Step back, away from the secured end of the exercise band, until the band stretches. Squeeze your shoulder blades together and pull your hands down to the sides of your thighs. Stop when your hands are straight down by your sides. Do not let your hands go behind your body. Hold for 2 seconds. Slowly return to the starting position. Repeat 2 times. Complete this exercise 3 times per week. Exercise D: Shoulder external rotation, prone Lie on your abdomen on a firm bed so your left / right forearm hangs over the edge of the bed and your upper arm is on the bed, straight out from your body. Your elbow should be bent. Your palm should be facing your feet. If instructed, hold a 2-5 lb weight in your hand. Squeeze your shoulder blade toward the middle of your back. Do not let your shoulder lift toward your ear. Keep your elbow bent in an "L" shape (90 degrees) while you  slowly move your forearm up toward the ceiling. Move your forearm up to the height of the bed, toward your head. Your upper arm should not move. At the top of the movement, your palm should face the floor. Hold for 1 second. Slowly return to the starting position and relax your muscles. Repeat 2 times. Complete this exercise 3 times per week. Exercise E: Scapular retraction and external rotation, rowing  Sit in a stable chair without armrests, or stand. Secure an exercise band to a stable object in front of you so it is at shoulder height. Hold one end of the exercise  band in each hand. Bring your arms out straight in front of you. Step back, away from the secured end of the exercise band, until the band stretches. Pull the band backward. As you do this, bend your elbows and squeeze your shoulder blades together, but avoid letting the rest of your body move. Do not let your shoulders lift up toward your ears. Stop when your elbows are at your sides or slightly behind your body. Hold for 1 second1. Slowly straighten your arms to return to the starting position. Repeat 2 times. Complete this exercise 3 times per week. Posture and body mechanics  Body mechanics refers to the movements and positions of your body while you do your daily activities. Posture is part of body mechanics. Good posture and healthy body mechanics can help to relieve stress in your body's tissues and joints. Good posture means that your spine is in its natural S-curve position (your spine is neutral), your shoulders are pulled back slightly, and your head is not tipped forward. The following are general guidelines for applying improved posture and body mechanics to your everyday activities. Standing  When standing, keep your spine neutral and your feet about hip-width apart. Keep a slight bend in your knees. Your ears, shoulders, and hips should line up. When you do a task in which you lean forward while standing in one place for a long time, place one foot up on a stable object that is 2-4 inches (5-10 cm) high, such as a footstool. This helps keep your spine neutral. Sitting  When sitting, keep your spine neutral and keep your feet flat on the floor. Use a footrest, if necessary, and keep your thighs parallel to the floor. Avoid rounding your shoulders, and avoid tilting your head forward. When working at a desk or a computer, keep your desk at a height where your hands are slightly lower than your elbows. Slide your chair under your desk so you are close enough to maintain good  posture. When working at a computer, place your monitor at a height where you are looking straight ahead and you do not have to tilt your head forward or downward to look at the screen. Resting  When lying down and resting, avoid positions that are most painful for you. If you have pain with activities such as sitting, bending, stooping, or squatting (flexion-based activities), lie in a position in which your body does not bend very much. For example, avoid curling up on your side with your arms and knees near your chest (fetal position). If you have pain with activities such as standing for a long time or reaching with your arms (extension-based activities), lie with your spine in a neutral position and bend your knees slightly. Try the following positions: Lying on your side with a pillow between your knees. Lying on your back with a pillow under your knees.  Lifting  When lifting objects, keep your feet at least shoulder-width apart and tighten your abdominal muscles. Bend your knees and hips and keep your spine neutral. It is important to lift using the strength of your legs, not your back. Do not lock your knees straight out. Always ask for help to lift heavy or awkward objects. Make sure you discuss any questions you have with your health care provider. Document Released: 12/08/2005 Document Revised: 08/14/2016 Document Reviewed: 09/19/2015 Elsevier Interactive Patient Education  [employer].

## 2024-06-20 ENCOUNTER — Encounter: Payer: Self-pay | Admitting: Family Medicine

## 2024-06-21 ENCOUNTER — Other Ambulatory Visit (INDEPENDENT_AMBULATORY_CARE_PROVIDER_SITE_OTHER)

## 2024-06-21 ENCOUNTER — Ambulatory Visit: Payer: Self-pay | Admitting: Family Medicine

## 2024-06-21 DIAGNOSIS — E7800 Pure hypercholesterolemia, unspecified: Secondary | ICD-10-CM

## 2024-06-21 LAB — HEPATIC FUNCTION PANEL
ALT: 24 U/L (ref 0–53)
AST: 15 U/L (ref 0–37)
Albumin: 4.3 g/dL (ref 3.5–5.2)
Alkaline Phosphatase: 49 U/L (ref 39–117)
Bilirubin, Direct: 0.2 mg/dL (ref 0.0–0.3)
Total Bilirubin: 0.8 mg/dL (ref 0.2–1.2)
Total Protein: 6.7 g/dL (ref 6.0–8.3)

## 2024-06-21 LAB — LIPID PANEL
Cholesterol: 112 mg/dL (ref 0–200)
HDL: 35.9 mg/dL — ABNORMAL LOW (ref >39.00)
LDL Cholesterol: 62 mg/dL (ref 0–99)
NonHDL: 75.96
Total CHOL/HDL Ratio: 3
Triglycerides: 69 mg/dL (ref 0.0–149.0)
VLDL: 13.8 mg/dL (ref 0.0–40.0)

## 2024-07-12 ENCOUNTER — Encounter: Payer: Self-pay | Admitting: Family Medicine

## 2024-08-08 ENCOUNTER — Encounter: Payer: Self-pay | Admitting: Family Medicine

## 2024-08-08 DIAGNOSIS — J302 Other seasonal allergic rhinitis: Secondary | ICD-10-CM

## 2024-08-09 ENCOUNTER — Other Ambulatory Visit: Payer: Self-pay

## 2024-08-09 DIAGNOSIS — J302 Other seasonal allergic rhinitis: Secondary | ICD-10-CM

## 2024-08-09 MED ORDER — LEVOCETIRIZINE DIHYDROCHLORIDE 5 MG PO TABS: 5.0000 mg | ORAL_TABLET | Freq: Every evening | ORAL | 2 refills | Status: DC

## 2024-11-08 ENCOUNTER — Encounter: Payer: Self-pay | Admitting: Family Medicine

## 2024-11-08 DIAGNOSIS — J302 Other seasonal allergic rhinitis: Secondary | ICD-10-CM

## 2024-11-08 MED ORDER — LEVOCETIRIZINE DIHYDROCHLORIDE 5 MG PO TABS: 5.0000 mg | ORAL_TABLET | Freq: Every evening | ORAL | 1 refills | Status: AC

## 2024-11-15 ENCOUNTER — Encounter: Payer: Self-pay | Admitting: Family Medicine

## 2024-11-21 ENCOUNTER — Encounter: Payer: Self-pay | Admitting: Family Medicine

## 2024-11-21 ENCOUNTER — Ambulatory Visit: Payer: Self-pay | Admitting: Family Medicine

## 2024-11-21 ENCOUNTER — Ambulatory Visit: Admitting: Family Medicine

## 2024-11-21 VITALS — BP 122/74 | HR 84 | Temp 98.0°F | Resp 16 | Ht 74.0 in | Wt 275.0 lb

## 2024-11-21 DIAGNOSIS — Z9189 Other specified personal risk factors, not elsewhere classified: Secondary | ICD-10-CM | POA: Insufficient documentation

## 2024-11-21 DIAGNOSIS — M79672 Pain in left foot: Secondary | ICD-10-CM

## 2024-11-21 DIAGNOSIS — R7989 Other specified abnormal findings of blood chemistry: Secondary | ICD-10-CM

## 2024-11-21 DIAGNOSIS — H6123 Impacted cerumen, bilateral: Secondary | ICD-10-CM

## 2024-11-21 DIAGNOSIS — E7800 Pure hypercholesterolemia, unspecified: Secondary | ICD-10-CM | POA: Insufficient documentation

## 2024-11-21 DIAGNOSIS — Z23 Encounter for immunization: Secondary | ICD-10-CM | POA: Diagnosis not present

## 2024-11-21 LAB — LIPID PANEL
Cholesterol: 131 mg/dL (ref 0–200)
HDL: 39.4 mg/dL (ref >39.00)
LDL Cholesterol: 75 mg/dL (ref 0–99)
NonHDL: 91.39
Total CHOL/HDL Ratio: 3
Triglycerides: 84 mg/dL (ref 0.0–149.0)
VLDL: 16.8 mg/dL (ref 0.0–40.0)

## 2024-11-21 LAB — CBC
HCT: 45.2 % (ref 39.0–52.0)
Hemoglobin: 15.2 g/dL (ref 13.0–17.0)
MCHC: 33.7 g/dL (ref 30.0–36.0)
MCV: 99.2 fl (ref 78.0–100.0)
Platelets: 279 K/uL (ref 150.0–400.0)
RBC: 4.56 Mil/uL (ref 4.22–5.81)
RDW: 12.6 % (ref 11.5–15.5)
WBC: 8.3 K/uL (ref 4.0–10.5)

## 2024-11-21 LAB — COMPREHENSIVE METABOLIC PANEL WITH GFR
ALT: 66 U/L — ABNORMAL HIGH (ref 0–53)
AST: 27 U/L (ref 0–37)
Albumin: 4.6 g/dL (ref 3.5–5.2)
Alkaline Phosphatase: 49 U/L (ref 39–117)
BUN: 21 mg/dL (ref 6–23)
CO2: 30 meq/L (ref 19–32)
Calcium: 9.9 mg/dL (ref 8.4–10.5)
Chloride: 105 meq/L (ref 96–112)
Creatinine, Ser: 1.06 mg/dL (ref 0.40–1.50)
GFR: 90.64 mL/min (ref >60.00)
Glucose, Bld: 88 mg/dL (ref 70–99)
Potassium: 4.3 meq/L (ref 3.5–5.1)
Sodium: 141 meq/L (ref 135–145)
Total Bilirubin: 0.5 mg/dL (ref 0.2–1.2)
Total Protein: 7 g/dL (ref 6.0–8.3)

## 2024-11-21 MED ORDER — METHYLPREDNISOLONE ACETATE 80 MG/ML IJ SUSP
80.0000 mg | Freq: Once | INTRAMUSCULAR | Status: AC
  Administered 2024-11-21: 80 mg via INTRAMUSCULAR

## 2024-11-21 NOTE — Patient Instructions (Addendum)
 Give us  2-3 business days to get the results of your labs back.   Keep the diet clean and stay active.  Please consider adding some weight resistance exercise to your routine. Consider yoga as well.   Practice safe sex.  Heat (pad or rice pillow in microwave) over affected area, 10-15 minutes twice daily.   Ice/cold pack over area for 10-15 min twice daily.  OK to take Tylenol  1000 mg (2 extra strength tabs) or 975 mg (3 regular strength tabs) every 6 hours as needed.  Send me a message in 1 mo if the foot is not better.   Consider arch supports. Consider Powerstep insoles. There are very quality over the counter inserts. Shop around online and in stores. Dr. Heriberto is a cheaper alternative, though is not as high of quality.   Let us  know if you need anything.  Ankle Exercises It is normal to feel mild stretching, pulling, tightness, or discomfort as you do these exercises, but you should stop right away if you feel sudden pain or your pain gets worse.  Stretching and range of motion exercises These exercises warm up your muscles and joints and improve the movement and flexibility of your ankle. These exercises also help to relieve pain, numbness, and tingling. Exercise A: Dorsiflexion/Plantar Flexion    Sit with your affected knee straight or bent. Do not rest your foot on anything. Flex your affected ankle to tilt the top of your foot toward your shin. Hold this position for 5 seconds. Point your toes downward to tilt the top of your foot away from your shin. Hold this position for 5 seconds. Repeat 2 times. Complete this exercise 3 times per week. Exercise B: Ankle Alphabet    Sit with your affected foot supported at your lower leg. Do not rest your foot on anything. Make sure your foot has room to move freely. Think of your affected foot as a paintbrush, and move your foot to trace each letter of the alphabet in the air. Keep your hip and knee still while you trace. Make  the letters as large as you can without increasing any discomfort. Trace every letter from A to Z. Repeat 2 times. Complete this exercise 3 times per week. Strengthening exercises These exercises build strength and endurance in your ankle. Endurance is the ability to use your muscles for a long time, even after they get tired. Exercise D: Dorsiflexors    Secure a rubber exercise band or tube to an object, such as a table leg, that will stay still when the band is pulled. Secure the other end around your affected foot. Sit on the floor, facing the object with your affected leg extended. The band or tube should be slightly tense when your foot is relaxed. Slowly flex your affected ankle and toes to bring your foot toward you. Hold this position for 3 seconds.  Slowly return your foot to the starting position, controlling the band as you do that. Do a total of 10 repetitions. Repeat 2 times. Complete this exercise 3 times per week. Exercise E: Plantar Flexors    Sit on the floor with your affected leg extended. Loop a rubber exercise band or tube around the ball of your affected foot. The ball of your foot is on the walking surface, right under your toes. The band or tube should be slightly tense when your foot is relaxed. Slowly point your toes downward, pushing them away from you. Hold this position for 3 seconds.  Slowly release the tension in the band or tube, controlling smoothly until your foot is back in the starting position. Repeat for a total of 10 repetitions. Repeat 2 times. Complete this exercise 3 times per week. Exercise F: Towel Curls    Sit in a chair on a non-carpeted surface, and put your feet on the floor. Place a towel in front of your feet.  Keeping your heel on the floor, put your affected foot on the towel. Pull the towel toward you by grabbing the towel with your toes and curling them under. Keep your heel on the floor. Let your toes relax. Grab the towel again.  Keep going until the towel is completely underneath your foot. Repeat for a total of 10 repetitions. Repeat 2 times. Complete this exercise 3 times per week. Exercise G: Heel Raise ( Plantar Flexors, Standing)     Stand with your feet shoulder-width apart. Keep your weight spread evenly over the width of your feet while you rise up on your toes. Use a wall or table to steady yourself, but try not to use it for support. If this exercise is too easy, try these options: Shift your weight toward your affected leg until you feel challenged. If told by your health care provider, lift your uninjured leg off the floor. Hold this position for 3 seconds. Repeat for a total of 10 repetitions. Repeat 2 times. Complete this exercise 3 times per week. Exercise H: Tandem Walking Stand with one foot directly in front of the other. Slowly raise your back foot up, lifting your heel before your toes, and place it directly in front of your other foot. Continue to walk in this heel-to-toe way for 10 steps or for as long as told by your health care provider. Have a countertop or wall nearby to use if needed to keep your balance, but try not to hold onto anything for support. Repeat 2 times. Complete this exercises 3 times per week. Make sure you discuss any questions you have with your health care provider. Document Released: 10/22/2005 Document Revised: 08/07/2016 Document Reviewed: 08/26/2015 Elsevier Interactive Patient Education  2018 [employer].

## 2024-11-21 NOTE — Progress Notes (Signed)
 Chief Complaint  Patient presents with   Follow-up    Follow Up    Subjective: Hyperlipidemia Patient presents for hyperlipidemia follow up. Currently taking Crestor  5 mg/d and compliance with treatment thus far has been good. He denies myalgias. He is sometimes adhering to a healthy diet. Exercise: walking at work No CP or SOB.  The patient is not known to have coexisting coronary artery disease.  PrEP Taking Truvada daily. Compliant, no AE's. He has 3 male sexual partners. They do not use protection.   L foot pain No inj or change in activity. Happened 1-2 mo ago. Not improving. Sharp pain. Pain is located top of the foot. No bruising, redness. Some swelling. He is on his feet for quite some time at work. More supportive footwear did seem to help. Has not tried anything otherwise. No neuro s/s's.   Past Medical History:  Diagnosis Date   Allergy    Environmental allergies     Objective: BP 122/74 (BP Location: Left Arm, Patient Position: Sitting)   Pulse 84   Temp 98 F (36.7 C) (Oral)   Resp 16   Ht 6' 2 (1.88 m)   Wt 275 lb (124.7 kg)   SpO2 98%   BMI 35.31 kg/m  General: Awake, appears stated age Ears: 100% obstructed with cerumen bilaterally Heart: RRR, no LE edema, no bruits Lungs: CTAB, no rales, wheezes or rhonchi. No accessory muscle use MSK: TTP over the extensor tendons over the talus on the left.  There is no bony tenderness.  No edema, erythema, excessive warmth, deformity. Psych: Age appropriate judgment and insight, normal affect and mood  Assessment and Plan: Pure hypercholesterolemia - Plan: Comprehensive metabolic panel with GFR, Lipid panel, CBC  At high risk for exposure to HIV - Plan: HIV Antibody (routine testing w rflx)  Left foot pain - Plan: methylPREDNISolone  acetate (DEPO-MEDROL ) injection 80 mg  Bilateral impacted cerumen  Need for hepatitis A immunization - Plan: Hepatitis A vaccine adult IM  Chronic, stable.  Continue Crestor  5  mg daily.  Counseled on diet and exercise. Chronic, stable.  Continue Truvada 200-200 mg daily.  Practice safe sex. Depo-Medrol  injection today as this historically works well for his pain.  Tendon rehab exercises provided in his paperwork.  Recommended arch support for his pes planus.  Consider referral to foot/ankle team if no significant improvement over the next month. Bilateral irrigation today. Hep A vaccination 2/2 today. F/u in 6 mo. The patient voiced understanding and agreement to the plan.  Mabel Mt Scissors, DO 11/21/24  8:57 AM

## 2024-11-22 LAB — HIV ANTIBODY (ROUTINE TESTING W REFLEX)
HIV 1&2 Ab, 4th Generation: NONREACTIVE
HIV FINAL INTERPRETATION: NEGATIVE

## 2024-12-07 ENCOUNTER — Ambulatory Visit: Payer: Self-pay | Admitting: Family Medicine

## 2024-12-07 ENCOUNTER — Other Ambulatory Visit

## 2024-12-07 DIAGNOSIS — R7989 Other specified abnormal findings of blood chemistry: Secondary | ICD-10-CM | POA: Diagnosis not present

## 2024-12-07 LAB — HEPATIC FUNCTION PANEL
ALT: 39 U/L (ref 3–53)
AST: 17 U/L (ref 5–37)
Albumin: 4.3 g/dL (ref 3.5–5.2)
Alkaline Phosphatase: 56 U/L (ref 39–117)
Bilirubin, Direct: 0.1 mg/dL (ref 0.1–0.3)
Total Bilirubin: 0.4 mg/dL (ref 0.2–1.2)
Total Protein: 6.7 g/dL (ref 6.0–8.3)

## 2024-12-20 ENCOUNTER — Encounter: Payer: Self-pay | Admitting: Family Medicine

## 2024-12-26 ENCOUNTER — Other Ambulatory Visit: Payer: Self-pay | Admitting: Family Medicine

## 2024-12-26 ENCOUNTER — Encounter: Payer: Self-pay | Admitting: Family Medicine

## 2024-12-27 ENCOUNTER — Other Ambulatory Visit: Payer: Self-pay

## 2024-12-27 ENCOUNTER — Other Ambulatory Visit (HOSPITAL_COMMUNITY): Payer: Self-pay

## 2024-12-27 ENCOUNTER — Telehealth: Payer: Self-pay

## 2024-12-27 MED ORDER — EMTRICITABINE-TENOFOVIR DF 200-300 MG PO TABS: 1.0000 | ORAL_TABLET | Freq: Every day | ORAL | 3 refills | Status: AC

## 2024-12-27 NOTE — Telephone Encounter (Signed)
 PA needed emtricitabine -tenofovir 

## 2024-12-27 NOTE — Telephone Encounter (Signed)
 Sent pt message about refill from PA team.

## 2024-12-27 NOTE — Telephone Encounter (Addendum)
 Pharmacy Patient Advocate Encounter   Received notification from Pt Calls Messages that prior authorization for Emtricitabine -tenofovir  200-300mg  tabs is required/requested.   Insurance verification completed.   The patient is insured through [employer].   Per test claim: Refill too soon. PA is not needed at this time. Medication was filled 12/26/24. Next eligible fill date is 03/04/25.  Spoke the with the [facility] at [facility] and she stated there was a duplicate request and she has the order in process for the patient. No prior authorization needed.
